# Patient Record
Sex: Female | Born: 2005 | Race: White | Hispanic: No | Marital: Single | Smoking: Never smoker
Health system: Southern US, Community
[De-identification: ages and names within clinical notes are randomized; demographics above are authoritative.]

## PROBLEM LIST (undated history)

## (undated) DIAGNOSIS — S43439A Superior glenoid labrum lesion of unspecified shoulder, initial encounter: Secondary | ICD-10-CM

## (undated) HISTORY — PX: ADENOIDECTOMY: SUR15

## (undated) HISTORY — PX: TYMPANOPLASTY: SHX33

---
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
## 2024-01-01 ENCOUNTER — Ambulatory Visit (HOSPITAL_BASED_OUTPATIENT_CLINIC_OR_DEPARTMENT_OTHER): Attending: Orthopaedic Surgery

## 2024-01-01 ENCOUNTER — Encounter (HOSPITAL_BASED_OUTPATIENT_CLINIC_OR_DEPARTMENT_OTHER): Payer: Self-pay

## 2024-01-01 DIAGNOSIS — M6281 Muscle weakness (generalized): Secondary | ICD-10-CM | POA: Diagnosis not present

## 2024-01-01 DIAGNOSIS — M25611 Stiffness of right shoulder, not elsewhere classified: Secondary | ICD-10-CM | POA: Insufficient documentation

## 2024-01-01 DIAGNOSIS — M25511 Pain in right shoulder: Secondary | ICD-10-CM | POA: Insufficient documentation

## 2024-01-01 NOTE — Therapy (Signed)
 OUTPATIENT PHYSICAL THERAPY SHOULDER TREATMENT   Patient Name: Barbara Crane MRN: [id number] DOB:[birth year], 18 y.o., female Today's Date: 01/01/2024  END OF SESSION:  PT End of Session - 01/01/24 1018     Visit Number 17    Number of Visits 28    Date for PT Re-Evaluation 01/08/24    Authorization Type [employer] [employer]    PT Start Time 0930    PT Stop Time 1012    PT Time Calculation (min) 42 min    Activity Tolerance Patient tolerated treatment well    Behavior During Therapy [facility] for tasks assessed/performed                       Past Medical History:  Diagnosis Date   Atypical mole 03/12/2020   left upper back (moderate to severe)   Labral tear of shoulder    Past Surgical History:  Procedure Laterality Date   ADENOIDECTOMY     SHOULDER ARTHROSCOPY WITH LABRAL REPAIR Right 10/11/2023   Procedure: RIGHT SHOULDER ARTHROSCOPY WITH LABRAL REPAIR;  Surgeon: Wilhelmenia Harada, MD;  Location: [facility];  Service: Orthopedics;  Laterality: Right;   TYMPANOPLASTY     Patient Active Problem List   Diagnosis Date Noted   Tear of right glenoid labrum 10/11/2023     REFERRING PROVIDER: Wilhelmenia Harada, MD  REFERRING DIAG: [phone] (ICD-10-CM) - Tear of right glenoid labrum, initial encounter  THERAPY DIAG:  Muscle weakness (generalized)  Stiffness of right shoulder, not elsewhere classified  Right shoulder pain, unspecified chronicity  Rationale for Evaluation and Treatment: Rehabilitation  ONSET DATE: DOS  10/11/2023  Days since surgery: 82   SUBJECTIVE:                                                                                                                                                                                      SUBJECTIVE STATEMENT:  Pt reports her L knee has been hurting more. Has been swelling a little. Only mild muscle soreness after last session.    Hand dominance: Right  PERTINENT HISTORY: Right shoulder  inferior labral repair on 10/11/2023.    PAIN:  0/10 R shoulder  PRECAUTIONS: Other: per surgical protocol   WEIGHT BEARING RESTRICTIONS: Yes R UE  FALLS:  Has patient fallen in last 6 months? No  LIVING ENVIRONMENT: Lives with: lives with mom Lives in: 2 story home Stairs: yes   OCCUPATION: Pt is a Consulting civil engineer  PLOF: Independent.  Pt was able to play soccer and throw a ball.    PATIENT GOALS:  to be able to play soccer.  To be able to throw a  ball  NEXT MD VISIT:   OBJECTIVE:  Note: Objective measures were completed at Evaluation unless otherwise noted.  DIAGNOSTIC FINDINGS:  Pt is post op.  She had an x ray and MRI last year.    UEFI: UEFI Score = 66  Interpretation: The indication in the original study is that lower scores indicate that the subject is reporting increased difficulty with the activities as a result of their upper limb condition. While higher scores indicate less severity.  UPPER EXTREMITY MMT:     HHD Right 5/7 Left 5/7  Shoulder flexion  14.0 25.6  Shoulder ext  19.3 14.5  Shoulder abduction  16.2 24.9  Shoulder adduction      Shoulder internal rotation  14.6  14.4  Shoulder external rotation  14.1  14.0  Elbow flexion      Elbow extension      Wrist flexion      Wrist extension      Wrist ulnar deviation      Wrist radial deviation      Wrist pronation      Wrist supination      (Blank rows = not tested).  AROM Right 5/7  Left 5/7  Shoulder flexion 165   Shoulder extension    Shoulder abduction 165   Shoulder adduction    Shoulder internal rotation 60   Shoulder external rotation 80   Elbow flexion    Elbow extension    Wrist flexion    Wrist extension    Wrist ulnar deviation    Wrist radial deviation    Wrist pronation    Wrist supination    (Blank rows = not tested)                                                                                                                            TREATMENT DATE:   6/3 UBE  ea way L3 Pt received R shoulder PROM in flexion, abd, ER, and IR w/n pt and tissue tolerance w/n protocol ranges  Supine serratus punches3# 2x15 Supine rhythmic stab's at 90 deg flexion x30 sec ea Prone hz abd 2x10 1# Prone flexion 2x10 1# Prone extension 2x10 2# Standing flexion 1# 2x10 Prone row  1# 2x10 Standing scaption 1# 2x10 Quadruped push ups x10 Ball rolls 4 way at wall 2.2# ball x30ea Side planks 10sec x5 Bicep curls 5# 2x10 Triceps extension GTB x30  5/30 UBE ea way L3 Pt received R shoulder PROM in flexion, abd, ER, and IR w/n pt and tissue tolerance w/n protocol ranges  Supine serratus punches3# 2x10 Supine rhythmic stab's at 90 deg flexion x30 sec ea Prone hz abd 2x10 1# Prone flexion 2x10 1# Prone extension 2x10 2# Standing flexion 1# 2x10 Prone row  1# 2x10 Standing scaption 1# 2x10 Wall push ups 2x10 Ball rolls 4 way at wall 2.2# ball x20ea Side planks 10sec x4 TRX row 2x10    5/28 Pt received R  shoulder PROM in flexion, abd, ER, and IR w/n pt and tissue tolerance w/n protocol ranges  Supine serratus punch 2# 2x12, 3# x10 Supine rhythmic stab's at 90 deg flexion 3x30 sec Prone hz abd 2x10 1#  Prone row 2x10 2# Prone extension x10 with 0#, x10 with 1# Standing jobe's flexion 1# x10, 2x10 2# Standing scaption 1#  3x10 ER/IR with arm at side 3x10 each with RTB    PATIENT EDUCATION: Education details: post op and protocol restrictions and limitations, ice usage, dx, relevant anatomy, prognosis, POC, exercise form, rationale of exercises, and HEP. Person educated: Patient Education method:  Explanation, Demonstration, Tactile cues, Verbal cues Education comprehension:  verbalized understanding, returned demonstration, verbal cues required, tactile cues required  HOME EXERCISE PROGRAM: Access Code: [email or web address] URL: [email or web address] Date: 10/17/2023 Prepared by: Marnie Siren      ASSESSMENT:  CLINICAL  IMPRESSION:  Good tolerance for progressions to therex today without complaint of pain. Improving with position with side planks, though some cuing still required. Demonstrates some shoulder weakness with bicep curls and push ups.  Advised pt see MD regarding L knee since it has not improved.     OBJECTIVE IMPAIRMENTS: decreased activity tolerance, decreased endurance, decreased ROM, decreased strength, hypomobility, impaired UE functional use, and pain.   ACTIVITY LIMITATIONS: carrying, lifting, bathing, dressing, reach over head, and hygiene/grooming  PARTICIPATION LIMITATIONS: cleaning, driving, community activity, and recreational acitivites  PERSONAL FACTORS:   REHAB POTENTIAL: Good  CLINICAL DECISION MAKING: Stable/uncomplicated  EVALUATION COMPLEXITY: Low   GOALS:  SHORT TERM GOALS:   Pt will be independent and compliant with HEP for improved pain, ROM, strength, and function.  Baseline: Goal status: MET Target date:  11/13/2023   2.  Pt will demo improved R shoulder PROM and AAROM to 90 deg of flexion and 20 deg of ER per protocol for improved stiffness and ROM  Baseline:  Goal status: GOAL MET  4/17 Target date:  11/13/2023  3.  Pt will demo improved R shoulder PROM to 130 deg of flexion and 40 deg of ER per protocol for improved stiffness and ROM  Baseline:  Goal status: MET Target date:  12/04/2023   4.  Pt will demo improved R shoulder AAROM to 130 deg of flexion in supine and 45 deg of ER and 90-100 deg of flexion AROM in supine for improved stiffness and shoulder mobility  Baseline:  Goal status: MTE Target date:  12/11/2023  5.  Pt will wean out of sling per protocol and MD orders without adverse effects  Baseline:  Goal status: MET Target date:  11/27/2023   6.  Pt will be able to actively elevate R UE > 90 deg in standing without significant shoulder hike for improved reaching  Baseline:  Goal status: MET Target date: 12/25/2023  7.  Pt will be able  to perform her self care activities with no > than minimal difficulty.   Goal status:  MET  Target date:  01/08/2024  8.  Pt will be able to reach into an overhead cabinet without difficulty.   Goal status:  MET  Target date:  01/15/2024    LONG TERM GOALS: Target date: 02/05/2024   Pt will be able to perform her ADLs and IADLs without significant difficulty or pain.  Baseline:  Goal status: ongoing  2.  Pt will be able to perform her normal reaching and overhead activities without significant difficulty or pain  Baseline:  Goal status: ongoing  3.  Pt will demo  R shoulder AROM to be [facility] t/o for performance of ADLs and IADLs.  Baseline:  Goal status: ongoing  4.  Pt will demo 5/5 strength t/o R shoulder for functional carrying and lifting and to assist with returning to recreational activities.  Baseline:  Goal status: ongoing    PLAN:  PT FREQUENCY: 1x/wk x 3-4 weeks and 2x/wk afterwards  PT DURATION: other: 16 weeks  PLANNED INTERVENTIONS: 97164- PT Re-evaluation, 97110-Therapeutic exercises, 97530- Therapeutic activity, 97112- Neuromuscular re-education, 97535- Self Care, [id number]- Manual therapy, [phone]- Aquatic Therapy, G0283- Electrical stimulation (unattended), 97035- Ultrasound, Patient/Family education, Taping, Dry Needling, Joint mobilization, Spinal mobilization, Scar mobilization, Cryotherapy, and Moist heat  PLAN FOR NEXT SESSION: Cont per Dr. Verline Glow labral repair protocol.    Herb Loges, PTA  01/01/24 10:42 AM

## 2024-01-10 ENCOUNTER — Ambulatory Visit (HOSPITAL_BASED_OUTPATIENT_CLINIC_OR_DEPARTMENT_OTHER)

## 2024-01-10 ENCOUNTER — Encounter (HOSPITAL_BASED_OUTPATIENT_CLINIC_OR_DEPARTMENT_OTHER): Payer: Self-pay

## 2024-01-10 DIAGNOSIS — M6281 Muscle weakness (generalized): Secondary | ICD-10-CM | POA: Diagnosis not present

## 2024-01-10 DIAGNOSIS — M25611 Stiffness of right shoulder, not elsewhere classified: Secondary | ICD-10-CM

## 2024-01-10 DIAGNOSIS — M25511 Pain in right shoulder: Secondary | ICD-10-CM | POA: Diagnosis not present

## 2024-01-10 NOTE — Therapy (Addendum)
 OUTPATIENT PHYSICAL THERAPY SHOULDER TREATMENT Progress Note Reporting Period 10/16/2023 to 01/08/2024  See note below for Objective Data and Assessment of Progress/Goals.       Patient Name: Barbara Crane MRN: [id number] DOB:04/05/2006, 18 y.o., female Today's Date: 01/10/2024  END OF SESSION:  PT End of Session - 01/10/24 1053     Visit Number 18    Number of Visits 26   Date for PT Re-Evaluation 02/07/24    Authorization Type Garth Kansky    PT Start Time 1055    PT Stop Time 1143    PT Time Calculation (min) 48 min    Activity Tolerance Patient tolerated treatment well    Behavior During Therapy [facility] for tasks assessed/performed                     Past Medical History:  Diagnosis Date   Atypical mole 03/12/2020   left upper back (moderate to severe)   Labral tear of shoulder    Past Surgical History:  Procedure Laterality Date   ADENOIDECTOMY     SHOULDER ARTHROSCOPY WITH LABRAL REPAIR Right 10/11/2023   Procedure: RIGHT SHOULDER ARTHROSCOPY WITH LABRAL REPAIR;  Surgeon: Wilhelmenia Harada, MD;  Location: [facility];  Service: Orthopedics;  Laterality: Right;   TYMPANOPLASTY     Patient Active Problem List   Diagnosis Date Noted   Tear of right glenoid labrum 10/11/2023     REFERRING PROVIDER: Wilhelmenia Harada, MD  REFERRING DIAG: [phone] (ICD-10-CM) - Tear of right glenoid labrum, initial encounter  THERAPY DIAG:  Muscle weakness (generalized)  Stiffness of right shoulder, not elsewhere classified  Right shoulder pain, unspecified chronicity  Rationale for Evaluation and Treatment: Rehabilitation  ONSET DATE: DOS  10/11/2023  Days since surgery: 91   SUBJECTIVE:                                                                                                                                                                                      SUBJECTIVE STATEMENT:  Pt reports she sees MD tomorrow for knee and  shoulder. No pain in shoulder.    Hand dominance: Right  PERTINENT HISTORY: Right shoulder inferior labral repair on 10/11/2023.    PAIN:  0/10 R shoulder  PRECAUTIONS: Other: per surgical protocol   WEIGHT BEARING RESTRICTIONS: Yes R UE  FALLS:  Has patient fallen in last 6 months? No  LIVING ENVIRONMENT: Lives with: lives with mom Lives in: 2 story home Stairs: yes   OCCUPATION: Pt is a Consulting civil engineer  PLOF: Independent.  Pt was able to play soccer and throw a ball.    PATIENT GOALS:  to  be able to play soccer.  To be able to throw a ball  NEXT MD VISIT:   OBJECTIVE:  Note: Objective measures were completed at Evaluation unless otherwise noted.  DIAGNOSTIC FINDINGS:  Pt is post op.  She had an x ray and MRI last year.    UEFI: UEFI Score = 66 6/12:70/80  Interpretation: The indication in the original study is that lower scores indicate that the subject is reporting increased difficulty with the activities as a result of their upper limb condition. While higher scores indicate less severity.  UPPER EXTREMITY MMT:     HHD Right 5/7 Left 5/7 Right 6/12 Left 6/12  Shoulder flexion  14.0 25.6 18.7 26.4  Shoulder ext  19.3 14.5    Shoulder abduction  16.2 24.9 17.9 23.3  Shoulder adduction        Shoulder internal rotation  14.6  14.4 13.3 16.0  Shoulder external rotation  14.1  14.0 16.8 15.1  Elbow flexion        Elbow extension        Wrist flexion        Wrist extension        Wrist ulnar deviation        Wrist radial deviation        Wrist pronation        Wrist supination        (Blank rows = not tested).  AROM Right 5/7  Left 5/7 Right 6/12  Shoulder flexion 165  170  Shoulder extension     Shoulder abduction 165  165  Shoulder adduction     Shoulder internal rotation 60  T6  Shoulder external rotation 80  T3  Elbow flexion     Elbow extension     Wrist flexion     Wrist extension     Wrist ulnar deviation     Wrist radial deviation      Wrist pronation     Wrist supination     (Blank rows = not tested)                                                                                                                            TREATMENT DATE:   6/12 UBE ea way L2 Pt received R shoulder PROM in flexion, abd, ER, and IR w/n pt and tissue tolerance w/n protocol ranges  Supine serratus punches3# 2x15 Supine rhythmic stab's at 90 deg flexion x30 sec ea Prone hz abd 2x10 2# Prone flexion 2x10 2# Prone extension 2x10 2# Standing flexion 1# 2x10 Standing scaption 2# 2x10 Standing flexion 2# 2x10 Quadruped push ups x10 Ball rolls 4 way at wall 2.2# ball x30ea Side planks 10sec x6 Bicep curls 5# 2x15 Triceps extension GTB x30 Standing IR 90/90 YTB x10 Standing ER 90/90 YTB x10 Prone ER 90/90 2# 2x10   6/3 UBE ea way L3 Pt received R shoulder PROM in flexion, abd, ER, and IR  w/n pt and tissue tolerance w/n protocol ranges  Supine serratus punches3# 2x15 Supine rhythmic stab's at 90 deg flexion x30 sec ea Prone hz abd 2x10 1# Prone flexion 2x10 1# Prone extension 2x10 2# Standing flexion 1# 2x10 Prone row  1# 2x10 Standing scaption 1# 2x10 Quadruped push ups x10 Ball rolls 4 way at wall 2.2# ball x30ea Side planks 10sec x5 Bicep curls 5# 2x10 Triceps extension GTB x30  5/30 UBE ea way L3 Pt received R shoulder PROM in flexion, abd, ER, and IR w/n pt and tissue tolerance w/n protocol ranges  Supine serratus punches3# 2x10 Supine rhythmic stab's at 90 deg flexion x30 sec ea Prone hz abd 2x10 1# Prone flexion 2x10 1# Prone extension 2x10 2# Standing flexion 1# 2x10 Prone row  1# 2x10 Standing scaption 1# 2x10 Wall push ups 2x10 Ball rolls 4 way at wall 2.2# ball x20ea Side planks 10sec x4 TRX row 2x10    5/28 Pt received R shoulder PROM in flexion, abd, ER, and IR w/n pt and tissue tolerance w/n protocol ranges  Supine serratus punch 2# 2x12, 3# x10 Supine rhythmic stab's at 90 deg  flexion 3x30 sec Prone hz abd 2x10 1#  Prone row 2x10 2# Prone extension x10 with 0#, x10 with 1# Standing jobe's flexion 1# x10, 2x10 2# Standing scaption 1#  3x10 ER/IR with arm at side 3x10 each with RTB    PATIENT EDUCATION: Education details: post op and protocol restrictions and limitations, ice usage, dx, relevant anatomy, prognosis, POC, exercise form, rationale of exercises, and HEP. Person educated: Patient Education method:  Explanation, Demonstration, Tactile cues, Verbal cues Education comprehension:  verbalized understanding, returned demonstration, verbal cues required, tactile cues required  HOME EXERCISE PROGRAM: Access Code: [email or web address] URL: [email or web address] Date: 10/17/2023 Prepared by: Marnie Siren      ASSESSMENT:  CLINICAL IMPRESSION:  Pt has attended 18 visits of PT thus far. Pt has met 8/8 STG and 1/4 LTG.She is 13 weeks s/p shoulder labrum repair. She has progressed excellently thus far. ROM is measured WNL each plane. Pt does demonstrate strength discrepancy bilaterally with hand dyno testing. Progressed to 90/90 rotational resisted movements today without complaint NMR has improved, though mild impairment noted here. Lack endurance with side planks. Pt will benefit from additional PT to progress higher level tasks to prevent re-injury.    OBJECTIVE IMPAIRMENTS: decreased activity tolerance, decreased endurance, decreased ROM, decreased strength, hypomobility, impaired UE functional use, and pain.   ACTIVITY LIMITATIONS: carrying, lifting, bathing, dressing, reach over head, and hygiene/grooming  PARTICIPATION LIMITATIONS: cleaning, driving, community activity, and recreational acitivites  PERSONAL FACTORS:   REHAB POTENTIAL: Good  CLINICAL DECISION MAKING: Stable/uncomplicated  EVALUATION COMPLEXITY: Low   GOALS:  SHORT TERM GOALS:   Pt will be independent and compliant with HEP for improved pain, ROM, strength, and  function.  Baseline: Goal status: MET Target date:  11/13/2023   2.  Pt will demo improved R shoulder PROM and AAROM to 90 deg of flexion and 20 deg of ER per protocol for improved stiffness and ROM  Baseline:  Goal status: GOAL MET  4/17 Target date:  11/13/2023  3.  Pt will demo improved R shoulder PROM to 130 deg of flexion and 40 deg of ER per protocol for improved stiffness and ROM  Baseline:  Goal status: MET Target date:  12/04/2023   4.  Pt will demo improved R shoulder AAROM to 130 deg of flexion in supine and 45 deg of ER and  90-100 deg of flexion AROM in supine for improved stiffness and shoulder mobility  Baseline:  Goal status: MTE Target date:  12/11/2023  5.  Pt will wean out of sling per protocol and MD orders without adverse effects  Baseline:  Goal status: MET Target date:  11/27/2023   6.  Pt will be able to actively elevate R UE > 90 deg in standing without significant shoulder hike for improved reaching  Baseline:  Goal status: MET Target date: 12/25/2023  7.  Pt will be able to perform her self care activities with no > than minimal difficulty.   Goal status:  MET  Target date:  01/08/2024  8.  Pt will be able to reach into an overhead cabinet without difficulty.   Goal status:  MET  Target date:  01/15/2024    LONG TERM GOALS: Target date: 02/07/2024   Pt will be able to perform her ADLs and IADLs without significant difficulty or pain.  Baseline:  Goal status: ongoing  2.  Pt will be able to perform her normal reaching and overhead activities without significant difficulty or pain  Baseline:  Goal status: ongoing  3.  Pt will demo R shoulder AROM to be [facility] t/o for performance of ADLs and IADLs.  Baseline:  Goal status: MET 6/12  4.  Pt will demo 5/5 strength t/o R shoulder for functional carrying and lifting and to assist with returning to recreational activities.  Baseline:  Goal status: ongoing    PLAN:  PT FREQUENCY: 2x/wk  PT  DURATION: other: 4 weeks  PLANNED INTERVENTIONS: 97164- PT Re-evaluation, 97110-Therapeutic exercises, 97530- Therapeutic activity, 97112- Neuromuscular re-education, 97535- Self Care, 65784- Manual therapy, [phone]- Aquatic Therapy, G0283- Electrical stimulation (unattended), 97035- Ultrasound, Patient/Family education, Taping, Dry Needling, Joint mobilization, Spinal mobilization, Scar mobilization, Cryotherapy, and Moist heat  PLAN FOR NEXT SESSION: Cont per Dr. Verline Glow labral repair protocol.    Herb Loges, PTA  01/10/24 1:30 PM  PT reviewed note and extended authorization date for 4 more weeks. Trina Fujita III PT, DPT 01/11/24 1:56 PM

## 2024-01-11 ENCOUNTER — Ambulatory Visit (HOSPITAL_BASED_OUTPATIENT_CLINIC_OR_DEPARTMENT_OTHER): Admitting: Orthopaedic Surgery

## 2024-01-11 DIAGNOSIS — M25362 Other instability, left knee: Secondary | ICD-10-CM | POA: Diagnosis not present

## 2024-01-11 NOTE — Progress Notes (Signed)
 Post Operative Evaluation    Procedure/Date of Surgery: Right shoulder labral repair 3/13  Interval History:    Presents today 12 weeks status post above procedure overall she is doing very well.  Overall she is doing extremely well from a right shoulder perspective.  She has no pain in the right shoulder and does feel overall quite stable.  With regard to the left knee she is continuing to get pain in the sitting position.  She has tried extensively therapy in the past to strengthen the left knee without any relief.  She has been seen by previous orthopedics in the left knee without any specific treatment recommendations.  She is still having giving out of the left knee and pain with sitting longer durations  PMH/PSH/Family History/Social History/Meds/Allergies:    Past Medical History:  Diagnosis Date   Atypical mole 03/12/2020   left upper back (moderate to severe)   Labral tear of shoulder    Past Surgical History:  Procedure Laterality Date   ADENOIDECTOMY     SHOULDER ARTHROSCOPY WITH LABRAL REPAIR Right 10/11/2023   Procedure: RIGHT SHOULDER ARTHROSCOPY WITH LABRAL REPAIR;  Surgeon: Wilhelmenia Harada, MD;  Location: [facility];  Service: Orthopedics;  Laterality: Right;   TYMPANOPLASTY     Social History   Socioeconomic History   Marital status: Single    Spouse name: Not on file   Number of children: Not on file   Years of education: Not on file   Highest education level: Not on file  Occupational History   Not on file  Tobacco Use   Smoking status: Never   Smokeless tobacco: Not on file  Vaping Use   Vaping status: Some Days   Substances: Nicotine  Substance and Sexual Activity   Alcohol use: No   Drug use: No   Sexual activity: Not on file  Other Topics Concern   Not on file  Social History Narrative   Not on file   Social Drivers of Health   Financial Resource Strain: Not on file  Food Insecurity: Not on  file  Transportation Needs: Not on file  Physical Activity: Not on file  Stress: Not on file  Social Connections: Not on file   Family History  Family history unknown: Yes   Allergies  Allergen Reactions   Decadron [Dexamethasone Sodium Phosphate] Other (See Comments)    Unsure   Amoxicillin Rash   Sulfa Antibiotics Rash   Current Outpatient Medications  Medication Sig Dispense Refill   acetaminophen  (TYLENOL ) 160 MG/5ML elixir Take 15.6 mLs (500 mg total) by mouth every 4 (four) hours as needed for fever. (Patient not taking: Reported on 12/07/2023) 120 mL 0   ibuprofen  (ADVIL ) 100 MG/5ML suspension Take 11.6 mLs (232 mg total) by mouth every 6 (six) hours as needed. Pain (Patient not taking: Reported on 12/07/2023) 30 mL 0   oxyCODONE  (ROXICODONE ) 5 MG/5ML solution Take 3 mLs (3 mg total) by mouth every 4 (four) hours as needed for severe pain (pain score 7-10). (Patient not taking: Reported on 12/07/2023) 15 mL 0   No current facility-administered medications for this visit.   No results found.  Review of Systems:   A ROS was performed including pertinent positives and negatives as documented in the HPI.   Musculoskeletal Exam:    There  were no vitals taken for this visit.  Right shoulder with passive range of motion 135 degrees forward elevation as well as 45 degrees external rotation.  Internal rotation is to T12.  Distal neurosensory exam is intact.  Incisions are well-appearing  Left knee with positive patellar grind, 3 quadrants of lateral patellar motion with full extension.  Range of motion is from -3 to 135 degrees, negative joint line tenderness bilaterally.  Trace effusion  Imaging:      I personally reviewed and interpreted the radiographs.   Assessment:   12 weeks status post right shoulder arthroscopic labral repair overall doing very well.  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  Given  this I did discuss that I would recommend therapy although she has extensively trialed therapy in the past.  As result I would like to proceed with an MRI of her left knee and follow-up disc Plan :    - Plan for an MRI left knee and follow-up to discuss results      I personally saw and evaluated the patient, and participated in the management and treatment plan.  Wilhelmenia Harada, MD Attending Physician, Orthopedic Surgery  This document was dictated using Dragon voice recognition software. A reasonable attempt at proof reading has been made to minimize errors.

## 2024-01-11 NOTE — Addendum Note (Signed)
 Addended by: Grier Leber on: 01/11/2024 01:57 PM   Modules accepted: Orders

## 2024-01-15 ENCOUNTER — Encounter (HOSPITAL_BASED_OUTPATIENT_CLINIC_OR_DEPARTMENT_OTHER): Payer: Self-pay

## 2024-01-15 ENCOUNTER — Ambulatory Visit (HOSPITAL_BASED_OUTPATIENT_CLINIC_OR_DEPARTMENT_OTHER)

## 2024-01-15 DIAGNOSIS — M25611 Stiffness of right shoulder, not elsewhere classified: Secondary | ICD-10-CM

## 2024-01-15 DIAGNOSIS — M6281 Muscle weakness (generalized): Secondary | ICD-10-CM | POA: Diagnosis not present

## 2024-01-15 DIAGNOSIS — M25511 Pain in right shoulder: Secondary | ICD-10-CM

## 2024-01-15 NOTE — Therapy (Signed)
 OUTPATIENT PHYSICAL THERAPY SHOULDER TREATMENT .       Patient Name: Barbara Crane MRN: [id number] DOB:[birth year], 18 y.o., female Today's Date: 01/15/2024  END OF SESSION:  PT End of Session - 01/10/24 1053     Visit Number 18    Number of Visits 26   Date for PT Re-Evaluation 02/07/24    Authorization Type Garth Kansky    PT Start Time 1055    PT Stop Time 1143    PT Time Calculation (min) 48 min    Activity Tolerance Patient tolerated treatment well    Behavior During Therapy [facility] for tasks assessed/performed                     Past Medical History:  Diagnosis Date   Atypical mole 03/12/2020   left upper back (moderate to severe)   Labral tear of shoulder    Past Surgical History:  Procedure Laterality Date   ADENOIDECTOMY     SHOULDER ARTHROSCOPY WITH LABRAL REPAIR Right 10/11/2023   Procedure: RIGHT SHOULDER ARTHROSCOPY WITH LABRAL REPAIR;  Surgeon: Wilhelmenia Harada, MD;  Location: [facility];  Service: Orthopedics;  Laterality: Right;   TYMPANOPLASTY     Patient Active Problem List   Diagnosis Date Noted   Tear of right glenoid labrum 10/11/2023     REFERRING PROVIDER: Wilhelmenia Harada, MD  REFERRING DIAG: [phone] (ICD-10-CM) - Tear of right glenoid labrum, initial encounter  THERAPY DIAG:  Muscle weakness (generalized)  Stiffness of right shoulder, not elsewhere classified  Right shoulder pain, unspecified chronicity  Rationale for Evaluation and Treatment: Rehabilitation  ONSET DATE: DOS  10/11/2023  Days since surgery: 96   SUBJECTIVE:                                                                                                                                                                                      SUBJECTIVE STATEMENT:  Pt reports MD is ordering MRI for her knee. Shoulder has been doing fine.    Hand dominance: Right  PERTINENT HISTORY: Right shoulder inferior labral repair on 10/11/2023.     PAIN:  0/10 R shoulder  PRECAUTIONS: Other: per surgical protocol   WEIGHT BEARING RESTRICTIONS: Yes R UE  FALLS:  Has patient fallen in last 6 months? No  LIVING ENVIRONMENT: Lives with: lives with mom Lives in: 2 story home Stairs: yes   OCCUPATION: Pt is a Consulting civil engineer  PLOF: Independent.  Pt was able to play soccer and throw a ball.    PATIENT GOALS:  to be able to play soccer.  To be able to throw a ball  NEXT MD VISIT:  OBJECTIVE:  Note: Objective measures were completed at Evaluation unless otherwise noted.  DIAGNOSTIC FINDINGS:  Pt is post op.  She had an x ray and MRI last year.    UEFI: UEFI Score = 66 6/12:70/80  Interpretation: The indication in the original study is that lower scores indicate that the subject is reporting increased difficulty with the activities as a result of their upper limb condition. While higher scores indicate less severity.  UPPER EXTREMITY MMT:     HHD Right 5/7 Left 5/7 Right 6/12 Left 6/12  Shoulder flexion  14.0 25.6 18.7 26.4  Shoulder ext  19.3 14.5    Shoulder abduction  16.2 24.9 17.9 23.3  Shoulder adduction        Shoulder internal rotation  14.6  14.4 13.3 16.0  Shoulder external rotation  14.1  14.0 16.8 15.1  Elbow flexion        Elbow extension        Wrist flexion        Wrist extension        Wrist ulnar deviation        Wrist radial deviation        Wrist pronation        Wrist supination        (Blank rows = not tested).  AROM Right 5/7  Left 5/7 Right 6/12  Shoulder flexion 165  170  Shoulder extension     Shoulder abduction 165  165  Shoulder adduction     Shoulder internal rotation 60  T6  Shoulder external rotation 80  T3  Elbow flexion     Elbow extension     Wrist flexion     Wrist extension     Wrist ulnar deviation     Wrist radial deviation     Wrist pronation     Wrist supination     (Blank rows = not tested)                                                                                                                             TREATMENT DATE:   6/12 UBE ea way L2 Pt received R shoulder PROM in flexion, abd, ER, and IR w/n pt and tissue tolerance w/n protocol ranges  Supine serratus punches3# 2x15 Supine rhythmic stab's at 90 deg flexion x30 sec ea Prone hz abd 2x10 2# Prone flexion 2x10 2# Prone extension 2x10 2# Prone ER 90/90 2# 2x10 Standing flexion 2# 2x10 Standing scaption 2# 2x10 Standing PNF D2 extension GTB 2x10 Standing PNF D2 flexion GTB 2x10 Quadruped push ups 2x10 Serratus push ups at wall 2x10 Ball rolls 4 way at wall 2.2# ball x30ea Side planks 10sec x7 R only Bicep curls 5# 2x15 Triceps extension GTB x30 Standing IR/ER 90/90 1# 2x10ea in front of mirror    6/3 UBE ea way L3 Pt received R shoulder PROM in flexion, abd, ER, and IR w/n pt and  tissue tolerance w/n protocol ranges  Supine serratus punches3# 2x15 Supine rhythmic stab's at 90 deg flexion x30 sec ea Prone hz abd 2x10 1# Prone flexion 2x10 1# Prone extension 2x10 2# Standing flexion 1# 2x10 Prone row  1# 2x10 Standing scaption 1# 2x10 Quadruped push ups x10 Ball rolls 4 way at wall 2.2# ball x30ea Side planks 10sec x5 Bicep curls 5# 2x10 Triceps extension GTB x30  5/30 UBE ea way L3 Pt received R shoulder PROM in flexion, abd, ER, and IR w/n pt and tissue tolerance w/n protocol ranges  Supine serratus punches3# 2x10 Supine rhythmic stab's at 90 deg flexion x30 sec ea Prone hz abd 2x10 1# Prone flexion 2x10 1# Prone extension 2x10 2# Standing flexion 1# 2x10 Prone row  1# 2x10 Standing scaption 1# 2x10 Wall push ups 2x10 Ball rolls 4 way at wall 2.2# ball x20ea Side planks 10sec x4 TRX row 2x10    5/28 Pt received R shoulder PROM in flexion, abd, ER, and IR w/n pt and tissue tolerance w/n protocol ranges  Supine serratus punch 2# 2x12, 3# x10 Supine rhythmic stab's at 90 deg flexion 3x30 sec Prone hz abd 2x10 1#  Prone row 2x10  2# Prone extension x10 with 0#, x10 with 1# Standing jobe's flexion 1# x10, 2x10 2# Standing scaption 1#  3x10 ER/IR with arm at side 3x10 each with RTB    PATIENT EDUCATION: Education details: post op and protocol restrictions and limitations, ice usage, dx, relevant anatomy, prognosis, POC, exercise form, rationale of exercises, and HEP. Person educated: Patient Education method:  Explanation, Demonstration, Tactile cues, Verbal cues Education comprehension:  verbalized understanding, returned demonstration, verbal cues required, tactile cues required  HOME EXERCISE PROGRAM: Access Code: [email or web address] URL: [email or web address] Date: 10/17/2023 Prepared by: Marnie Siren      ASSESSMENT:  CLINICAL IMPRESSION:  Continued with shoulder progressions today with good tolerance. She is improving with endurance for side plank exercise. Cuing required for maintaining proper 90/90 position with IR/ER. Reported difficulty with ER from this position compared to  shoulder. No complaints of pain with exercises. Will continue to progress as tolerated.    OBJECTIVE IMPAIRMENTS: decreased activity tolerance, decreased endurance, decreased ROM, decreased strength, hypomobility, impaired UE functional use, and pain.   ACTIVITY LIMITATIONS: carrying, lifting, bathing, dressing, reach over head, and hygiene/grooming  PARTICIPATION LIMITATIONS: cleaning, driving, community activity, and recreational acitivites  PERSONAL FACTORS:   REHAB POTENTIAL: Good  CLINICAL DECISION MAKING: Stable/uncomplicated  EVALUATION COMPLEXITY: Low   GOALS:  SHORT TERM GOALS:   Pt will be independent and compliant with HEP for improved pain, ROM, strength, and function.  Baseline: Goal status: MET Target date:  11/13/2023   2.  Pt will demo improved R shoulder PROM and AAROM to 90 deg of flexion and 20 deg of ER per protocol for improved stiffness and ROM  Baseline:  Goal status: GOAL MET   4/17 Target date:  11/13/2023  3.  Pt will demo improved R shoulder PROM to 130 deg of flexion and 40 deg of ER per protocol for improved stiffness and ROM  Baseline:  Goal status: MET Target date:  12/04/2023   4.  Pt will demo improved R shoulder AAROM to 130 deg of flexion in supine and 45 deg of ER and 90-100 deg of flexion AROM in supine for improved stiffness and shoulder mobility  Baseline:  Goal status: MTE Target date:  12/11/2023  5.  Pt will wean out of sling per protocol and MD  orders without adverse effects  Baseline:  Goal status: MET Target date:  11/27/2023   6.  Pt will be able to actively elevate R UE > 90 deg in standing without significant shoulder hike for improved reaching  Baseline:  Goal status: MET Target date: 12/25/2023  7.  Pt will be able to perform her self care activities with no > than minimal difficulty.   Goal status:  MET  Target date:  01/08/2024  8.  Pt will be able to reach into an overhead cabinet without difficulty.   Goal status:  MET  Target date:  01/15/2024    LONG TERM GOALS: Target date: 02/07/2024   Pt will be able to perform her ADLs and IADLs without significant difficulty or pain.  Baseline:  Goal status: ongoing  2.  Pt will be able to perform her normal reaching and overhead activities without significant difficulty or pain  Baseline:  Goal status: ongoing  3.  Pt will demo R shoulder AROM to be [facility] t/o for performance of ADLs and IADLs.  Baseline:  Goal status: MET 6/12  4.  Pt will demo 5/5 strength t/o R shoulder for functional carrying and lifting and to assist with returning to recreational activities.  Baseline:  Goal status: ongoing    PLAN:  PT FREQUENCY: 2x/wk  PT DURATION: other: 4 weeks  PLANNED INTERVENTIONS: 97164- PT Re-evaluation, 97110-Therapeutic exercises, 97530- Therapeutic activity, 97112- Neuromuscular re-education, 97535- Self Care, 40981- Manual therapy, [phone]- Aquatic Therapy, G0283-  Electrical stimulation (unattended), 97035- Ultrasound, Patient/Family education, Taping, Dry Needling, Joint mobilization, Spinal mobilization, Scar mobilization, Cryotherapy, and Moist heat  PLAN FOR NEXT SESSION: Cont per Dr. Verline Glow labral repair protocol.    Herb Loges, PTA  01/15/24 5:00 PM

## 2024-01-21 ENCOUNTER — Encounter (HOSPITAL_BASED_OUTPATIENT_CLINIC_OR_DEPARTMENT_OTHER): Payer: Self-pay | Admitting: Physical Therapy

## 2024-01-21 ENCOUNTER — Ambulatory Visit (HOSPITAL_BASED_OUTPATIENT_CLINIC_OR_DEPARTMENT_OTHER): Admitting: Physical Therapy

## 2024-01-21 DIAGNOSIS — M25611 Stiffness of right shoulder, not elsewhere classified: Secondary | ICD-10-CM | POA: Diagnosis not present

## 2024-01-21 DIAGNOSIS — M25511 Pain in right shoulder: Secondary | ICD-10-CM

## 2024-01-21 DIAGNOSIS — M6281 Muscle weakness (generalized): Secondary | ICD-10-CM | POA: Diagnosis not present

## 2024-01-21 NOTE — Therapy (Signed)
 OUTPATIENT PHYSICAL THERAPY SHOULDER TREATMENT .       Patient Name: Barbara Crane MRN: [id number] DOB:2005-10-06, 18 y.o., female Today's Date: 01/21/2024  END OF SESSION:  PT End of Session - 01/10/24 1053     Visit Number 18    Number of Visits 26   Date for PT Re-Evaluation 02/07/24    Authorization Type [name]    PT Start Time 1055    PT Stop Time 1143    PT Time Calculation (min) 48 min    Activity Tolerance Patient tolerated treatment well    Behavior During Therapy [facility] for tasks assessed/performed                     Past Medical History:  Diagnosis Date   Atypical mole 03/12/2020   left upper back (moderate to severe)   Labral tear of shoulder    Past Surgical History:  Procedure Laterality Date   ADENOIDECTOMY     SHOULDER ARTHROSCOPY WITH LABRAL REPAIR Right 10/11/2023   Procedure: RIGHT SHOULDER ARTHROSCOPY WITH LABRAL REPAIR;  Surgeon: Genelle Standing, MD;  Location: [facility];  Service: Orthopedics;  Laterality: Right;   TYMPANOPLASTY     Patient Active Problem List   Diagnosis Date Noted   Tear of right glenoid labrum 10/11/2023     REFERRING PROVIDER: Genelle Standing, MD  REFERRING DIAG: [phone] (ICD-10-CM) - Tear of right glenoid labrum, initial encounter  THERAPY DIAG:  Muscle weakness (generalized)  Stiffness of right shoulder, not elsewhere classified  Right shoulder pain, unspecified chronicity  Rationale for Evaluation and Treatment: Rehabilitation  ONSET DATE: DOS  10/11/2023  Days since surgery: 102   SUBJECTIVE:                                                                                                                                                                                      SUBJECTIVE STATEMENT:  Pt reports no issues with the shoulder since last session. Has been compliant with HEP. Previous session was difficult.    Hand dominance: Right  PERTINENT HISTORY: Right  shoulder inferior labral repair on 10/11/2023.    PAIN:  0/10 R shoulder  PRECAUTIONS: Other: per surgical protocol   WEIGHT BEARING RESTRICTIONS: Yes R UE  FALLS:  Has patient fallen in last 6 months? No  LIVING ENVIRONMENT: Lives with: lives with mom Lives in: 2 story home Stairs: yes   OCCUPATION: Pt is a Consulting civil engineer  PLOF: Independent.  Pt was able to play soccer and throw a ball.    PATIENT GOALS:  to be able to play soccer.  To be able to throw a  ball  NEXT MD VISIT:   OBJECTIVE:  Note: Objective measures were completed at Evaluation unless otherwise noted.  DIAGNOSTIC FINDINGS:  Pt is post op.  She had an x ray and MRI last year.    UEFI: UEFI Score = 66 6/12:70/80  Interpretation: The indication in the original study is that lower scores indicate that the subject is reporting increased difficulty with the activities as a result of their upper limb condition. While higher scores indicate less severity.  UPPER EXTREMITY MMT:     HHD Right 5/7 Left 5/7 Right 6/12 Left 6/12  Shoulder flexion  14.0 25.6 18.7 26.4  Shoulder ext  19.3 14.5    Shoulder abduction  16.2 24.9 17.9 23.3  Shoulder adduction        Shoulder internal rotation  14.6  14.4 13.3 16.0  Shoulder external rotation  14.1  14.0 16.8 15.1  Elbow flexion        Elbow extension        Wrist flexion        Wrist extension        Wrist ulnar deviation        Wrist radial deviation        Wrist pronation        Wrist supination        (Blank rows = not tested).  AROM Right 5/7  Left 5/7 Right 6/12  Shoulder flexion 165  170  Shoulder extension     Shoulder abduction 165  165  Shoulder adduction     Shoulder internal rotation 60  T6  Shoulder external rotation 80  T3  Elbow flexion     Elbow extension     Wrist flexion     Wrist extension     Wrist ulnar deviation     Wrist radial deviation     Wrist pronation     Wrist supination     (Blank rows = not tested)                                                                                                                             TREATMENT DATE:   6/23  UBE 2.5 min, lvl 1 each way  Bear position shoulder taps in protraction 3x12 Side plank 3x8 1s holds 90/90 RTB IR/ER 3x10 ea Kneeling push 3x8 Single arm blue TB row double band 3x10 OH wall bounce 3x20 Lateral raise 3# 3x8 Arnold press 3# 3x8   6/12 UBE ea way L2 Pt received R shoulder PROM in flexion, abd, ER, and IR w/n pt and tissue tolerance w/n protocol ranges  Supine serratus punches3# 2x15 Supine rhythmic stab's at 90 deg flexion x30 sec ea Prone hz abd 2x10 2# Prone flexion 2x10 2# Prone extension 2x10 2# Prone ER 90/90 2# 2x10 Standing flexion 2# 2x10 Standing scaption 2# 2x10 Standing PNF D2 extension GTB 2x10 Standing PNF D2 flexion GTB 2x10 Quadruped push ups 2x10 Serratus push  ups at wall 2x10 Ball rolls 4 way at wall 2.2# ball x30ea Side planks 10sec x7 R only Bicep curls 5# 2x15 Triceps extension GTB x30 Standing IR/ER 90/90 1# 2x10ea in front of mirror    6/3 UBE ea way L3 Pt received R shoulder PROM in flexion, abd, ER, and IR w/n pt and tissue tolerance w/n protocol ranges  Supine serratus punches3# 2x15 Supine rhythmic stab's at 90 deg flexion x30 sec ea Prone hz abd 2x10 1# Prone flexion 2x10 1# Prone extension 2x10 2# Standing flexion 1# 2x10 Prone row  1# 2x10 Standing scaption 1# 2x10 Quadruped push ups x10 Ball rolls 4 way at wall 2.2# ball x30ea Side planks 10sec x5 Bicep curls 5# 2x10 Triceps extension GTB x30  5/30 UBE ea way L3 Pt received R shoulder PROM in flexion, abd, ER, and IR w/n pt and tissue tolerance w/n protocol ranges  Supine serratus punches3# 2x10 Supine rhythmic stab's at 90 deg flexion x30 sec ea Prone hz abd 2x10 1# Prone flexion 2x10 1# Prone extension 2x10 2# Standing flexion 1# 2x10 Prone row  1# 2x10 Standing scaption 1# 2x10 Wall push ups 2x10 Ball rolls 4  way at wall 2.2# ball x20ea Side planks 10sec x4 TRX row 2x10    5/28 Pt received R shoulder PROM in flexion, abd, ER, and IR w/n pt and tissue tolerance w/n protocol ranges  Supine serratus punch 2# 2x12, 3# x10 Supine rhythmic stab's at 90 deg flexion 3x30 sec Prone hz abd 2x10 1#  Prone row 2x10 2# Prone extension x10 with 0#, x10 with 1# Standing jobe's flexion 1# x10, 2x10 2# Standing scaption 1#  3x10 ER/IR with arm at side 3x10 each with RTB    PATIENT EDUCATION: Education details: post op and protocol restrictions and limitations, ice usage, dx, relevant anatomy, prognosis, POC, exercise form, rationale of exercises, and HEP. Person educated: Patient Education method:  Explanation, Demonstration, Tactile cues, Verbal cues Education comprehension:  verbalized understanding, returned demonstration, verbal cues required, tactile cues required  HOME EXERCISE PROGRAM: Access Code: [email or web address] URL: [email or web address] Date: 10/17/2023 Prepared by: Mose Minerva      ASSESSMENT:  CLINICAL IMPRESSION:  Pt 14 wks at this time. Pt CKC stability and OH strength introduced. Pt able to start very light OH plyo and OH strengthening type exercise without pain. Fatigue does set in quickly and pt loses scapular control but able to continue with all exercise when given appropriate cuing. Plan to continue with strengthening per protocol as pt would like to throw again in the future. Pt would benefit from continued skilled therapy in order to reach goals and maximize functional R UE strength and ROM for full return to PLOF.   OBJECTIVE IMPAIRMENTS: decreased activity tolerance, decreased endurance, decreased ROM, decreased strength, hypomobility, impaired UE functional use, and pain.   ACTIVITY LIMITATIONS: carrying, lifting, bathing, dressing, reach over head, and hygiene/grooming  PARTICIPATION LIMITATIONS: cleaning, driving, community activity, and recreational  acitivites  PERSONAL FACTORS:   REHAB POTENTIAL: Good  CLINICAL DECISION MAKING: Stable/uncomplicated  EVALUATION COMPLEXITY: Low   GOALS:  SHORT TERM GOALS:   Pt will be independent and compliant with HEP for improved pain, ROM, strength, and function.  Baseline: Goal status: MET Target date:  11/13/2023   2.  Pt will demo improved R shoulder PROM and AAROM to 90 deg of flexion and 20 deg of ER per protocol for improved stiffness and ROM  Baseline:  Goal status: GOAL MET  4/17 Target  date:  11/13/2023  3.  Pt will demo improved R shoulder PROM to 130 deg of flexion and 40 deg of ER per protocol for improved stiffness and ROM  Baseline:  Goal status: MET Target date:  12/04/2023   4.  Pt will demo improved R shoulder AAROM to 130 deg of flexion in supine and 45 deg of ER and 90-100 deg of flexion AROM in supine for improved stiffness and shoulder mobility  Baseline:  Goal status: MTE Target date:  12/11/2023  5.  Pt will wean out of sling per protocol and MD orders without adverse effects  Baseline:  Goal status: MET Target date:  11/27/2023   6.  Pt will be able to actively elevate R UE > 90 deg in standing without significant shoulder hike for improved reaching  Baseline:  Goal status: MET Target date: 12/25/2023  7.  Pt will be able to perform her self care activities with no > than minimal difficulty.   Goal status:  MET  Target date:  01/08/2024  8.  Pt will be able to reach into an overhead cabinet without difficulty.   Goal status:  MET  Target date:  01/15/2024    LONG TERM GOALS: Target date: 02/07/2024   Pt will be able to perform her ADLs and IADLs without significant difficulty or pain.  Baseline:  Goal status: ongoing  2.  Pt will be able to perform her normal reaching and overhead activities without significant difficulty or pain  Baseline:  Goal status: ongoing  3.  Pt will demo R shoulder AROM to be [facility] t/o for performance of ADLs and IADLs.   Baseline:  Goal status: MET 6/12  4.  Pt will demo 5/5 strength t/o R shoulder for functional carrying and lifting and to assist with returning to recreational activities.  Baseline:  Goal status: ongoing    PLAN:  PT FREQUENCY: 2x/wk  PT DURATION: other: 4 weeks  PLANNED INTERVENTIONS: 97164- PT Re-evaluation, 97110-Therapeutic exercises, 97530- Therapeutic activity, 97112- Neuromuscular re-education, 97535- Self Care, [id number]- Manual therapy, [id number]- Aquatic Therapy, G0283- Electrical stimulation (unattended), 97035- Ultrasound, Patient/Family education, Taping, Dry Needling, Joint mobilization, Spinal mobilization, Scar mobilization, Cryotherapy, and Moist heat  PLAN FOR NEXT SESSION: Cont per Dr. Danetta labral repair protocol.    Dale Call PT, DPT 01/21/24 11:41 AM

## 2024-01-22 ENCOUNTER — Encounter (HOSPITAL_BASED_OUTPATIENT_CLINIC_OR_DEPARTMENT_OTHER)

## 2024-01-30 ENCOUNTER — Ambulatory Visit (HOSPITAL_BASED_OUTPATIENT_CLINIC_OR_DEPARTMENT_OTHER): Attending: Orthopaedic Surgery | Admitting: Physical Therapy

## 2024-01-30 ENCOUNTER — Encounter (HOSPITAL_BASED_OUTPATIENT_CLINIC_OR_DEPARTMENT_OTHER): Payer: Self-pay | Admitting: Physical Therapy

## 2024-01-30 DIAGNOSIS — M6281 Muscle weakness (generalized): Secondary | ICD-10-CM | POA: Diagnosis not present

## 2024-01-30 DIAGNOSIS — M25511 Pain in right shoulder: Secondary | ICD-10-CM | POA: Diagnosis not present

## 2024-01-30 DIAGNOSIS — M25611 Stiffness of right shoulder, not elsewhere classified: Secondary | ICD-10-CM | POA: Insufficient documentation

## 2024-01-30 NOTE — Therapy (Addendum)
 OUTPATIENT PHYSICAL THERAPY UPPER EXTREMITY EVALUATION   Patient Name: Barbara Crane MRN: [id number] DOB:2005-08-27, 18 y.o., female Today's Date: 02/06/2024  END OF SESSION:   Past Medical History:  Diagnosis Date   Atypical mole 03/12/2020   left upper back (moderate to severe)   Labral tear of shoulder    Past Surgical History:  Procedure Laterality Date   ADENOIDECTOMY     SHOULDER ARTHROSCOPY WITH LABRAL REPAIR Right 10/11/2023   Procedure: RIGHT SHOULDER ARTHROSCOPY WITH LABRAL REPAIR;  Surgeon: Genelle Standing, MD;  Location: [facility];  Service: Orthopedics;  Laterality: Right;   TYMPANOPLASTY     Patient Active Problem List   Diagnosis Date Noted   Tear of right glenoid labrum 10/11/2023     REFERRING PROVIDER: Genelle Standing, MD  REFERRING DIAG: [phone] (ICD-10-CM) - Tear of right glenoid labrum, initial encounter  THERAPY DIAG:  Muscle weakness (generalized)  Stiffness of right shoulder, not elsewhere classified  Right shoulder pain, unspecified chronicity  Rationale for Evaluation and Treatment: Rehabilitation  ONSET DATE: DOS  10/11/2023  Days since surgery: 111   SUBJECTIVE:                                                                                                                                                                                      SUBJECTIVE STATEMENT:  Pt reports no issues with the shoulder since last session. Has been compliant with HEP. Pt reports no significant changes since last session.    Hand dominance: Right  PERTINENT HISTORY: Right shoulder inferior labral repair on 10/11/2023.    PAIN:  0/10 R shoulder  PRECAUTIONS: Other: per surgical protocol   WEIGHT BEARING RESTRICTIONS: Yes R UE  FALLS:  Has patient fallen in last 6 months? No  LIVING ENVIRONMENT: Lives with: lives with mom Lives in: 2 story home Stairs: yes   OCCUPATION: Pt is a Consulting civil engineer  PLOF: Independent.  Pt was able  to play soccer and throw a ball.    PATIENT GOALS:  to be able to play soccer.  To be able to throw a ball  NEXT MD VISIT:   OBJECTIVE:  Note: Objective measures were completed at Evaluation unless otherwise noted.  DIAGNOSTIC FINDINGS:  Pt is post op.  She had an x ray and MRI last year.    UEFI: UEFI Score = 66 6/12:70/80  Interpretation: The indication in the original study is that lower scores indicate that the subject is reporting increased difficulty with the activities as a result of their upper limb condition. While higher scores indicate less severity.  UPPER EXTREMITY MMT:     HHD Right 5/7 Left 5/7 Right  6/12 Left 6/12  Shoulder flexion  14.0 25.6 18.7 26.4  Shoulder ext  19.3 14.5    Shoulder abduction  16.2 24.9 17.9 23.3  Shoulder adduction        Shoulder internal rotation  14.6  14.4 13.3 16.0  Shoulder external rotation  14.1  14.0 16.8 15.1  Elbow flexion        Elbow extension        Wrist flexion        Wrist extension        Wrist ulnar deviation        Wrist radial deviation        Wrist pronation        Wrist supination        (Blank rows = not tested).  AROM Right 5/7  Left 5/7 Right 6/12  Shoulder flexion 165  170  Shoulder extension     Shoulder abduction 165  165  Shoulder adduction     Shoulder internal rotation 60  T6  Shoulder external rotation 80  T3  Elbow flexion     Elbow extension     Wrist flexion     Wrist extension     Wrist ulnar deviation     Wrist radial deviation     Wrist pronation     Wrist supination     (Blank rows = not tested)                                                                                                                            TREATMENT DATE:   7/2  UBE 2.5 min, lvl 1 each way  Explosive kneeling push ups 4x5 Tall arm side plank 3x8 1s holds Pike toe taps 3x12 GTB IR/ER 90/90 2x10 Wall ball chest pass (high force) 4x8 8lb ball Double UE Medball slams: vertical 8 lbs Medball  toss: vertical 4x3 Bicep curls 10lbs 3x6  6/23  UBE 2.5 min, lvl 1 each way  Bear position shoulder taps in protraction 3x12 Side plank 3x8 1s holds 90/90 RTB IR/ER 3x10 ea Kneeling push 3x8 Single arm blue TB row double band 3x10 OH wall bounce 3x20 Lateral raise 3# 3x8 Arnold press 3# 3x8   6/12 UBE ea way L2 Pt received R shoulder PROM in flexion, abd, ER, and IR w/n pt and tissue tolerance w/n protocol ranges  Supine serratus punches3# 2x15 Supine rhythmic stab's at 90 deg flexion x30 sec ea Prone hz abd 2x10 2# Prone flexion 2x10 2# Prone extension 2x10 2# Prone ER 90/90 2# 2x10 Standing flexion 2# 2x10 Standing scaption 2# 2x10 Standing PNF D2 extension GTB 2x10 Standing PNF D2 flexion GTB 2x10 Quadruped push ups 2x10 Serratus push ups at wall 2x10 Ball rolls 4 way at wall 2.2# ball x30ea Side planks 10sec x7 R only Bicep curls 5# 2x15 Triceps extension GTB x30 Standing IR/ER 90/90 1# 2x10ea in front of mirror    6/3 UBE  ea way L3 Pt received R shoulder PROM in flexion, abd, ER, and IR w/n pt and tissue tolerance w/n protocol ranges  Supine serratus punches3# 2x15 Supine rhythmic stab's at 90 deg flexion x30 sec ea Prone hz abd 2x10 1# Prone flexion 2x10 1# Prone extension 2x10 2# Standing flexion 1# 2x10 Prone row  1# 2x10 Standing scaption 1# 2x10 Quadruped push ups x10 Ball rolls 4 way at wall 2.2# ball x30ea Side planks 10sec x5 Bicep curls 5# 2x10 Triceps extension GTB x30  5/30 UBE ea way L3 Pt received R shoulder PROM in flexion, abd, ER, and IR w/n pt and tissue tolerance w/n protocol ranges  Supine serratus punches3# 2x10 Supine rhythmic stab's at 90 deg flexion x30 sec ea Prone hz abd 2x10 1# Prone flexion 2x10 1# Prone extension 2x10 2# Standing flexion 1# 2x10 Prone row  1# 2x10 Standing scaption 1# 2x10 Wall push ups 2x10 Ball rolls 4 way at wall 2.2# ball x20ea Side planks 10sec x4 TRX row  2x10    5/28 Pt received R shoulder PROM in flexion, abd, ER, and IR w/n pt and tissue tolerance w/n protocol ranges  Supine serratus punch 2# 2x12, 3# x10 Supine rhythmic stab's at 90 deg flexion 3x30 sec Prone hz abd 2x10 1#  Prone row 2x10 2# Prone extension x10 with 0#, x10 with 1# Standing jobe's flexion 1# x10, 2x10 2# Standing scaption 1#  3x10 ER/IR with arm at side 3x10 each with RTB    PATIENT EDUCATION: Education details: post op and protocol restrictions and limitations, ice usage, dx, relevant anatomy, prognosis, POC, exercise form, rationale of exercises, and HEP. Person educated: Patient Education method:  Explanation, Demonstration, Tactile cues, Verbal cues Education comprehension:  verbalized understanding, returned demonstration, verbal cues required, tactile cues required  HOME EXERCISE PROGRAM: Access Code: [email or web address] URL: [email or web address] Date: 10/17/2023 Prepared by: Mose Minerva      ASSESSMENT:  CLINICAL IMPRESSION: Pt nearly 4 months post op. Pt with good tolerance to progression of strengthening as well as force development exercise but continues to fatigue quickly. Pt's 90/90 strength has improved greatly since last session but pt lacks power development and high rate for force production in double UE setting. HEP updated today accordingly to promote better power dev on R UE. Pt advised on double UE only at this time. Plan for re-assessment and strength test again at next session for re-cert and decrease in frequency with therapy as long as there are no adverse events. Pt would benefit from continued skilled therapy in order to reach goals and maximize functional R UE strength and ROM for full return to PLOF.   OBJECTIVE IMPAIRMENTS: decreased activity tolerance, decreased endurance, decreased ROM, decreased strength, hypomobility, impaired UE functional use, and pain.   ACTIVITY LIMITATIONS: carrying, lifting, bathing, dressing,  reach over head, and hygiene/grooming  PARTICIPATION LIMITATIONS: cleaning, driving, community activity, and recreational acitivites  PERSONAL FACTORS:   REHAB POTENTIAL: Good  CLINICAL DECISION MAKING: Stable/uncomplicated  EVALUATION COMPLEXITY: Low   GOALS:  SHORT TERM GOALS:   Pt will be independent and compliant with HEP for improved pain, ROM, strength, and function.  Baseline: Goal status: MET Target date:  11/13/2023   2.  Pt will demo improved R shoulder PROM and AAROM to 90 deg of flexion and 20 deg of ER per protocol for improved stiffness and ROM  Baseline:  Goal status: GOAL MET  4/17 Target date:  11/13/2023  3.  Pt will demo improved R shoulder PROM  to 130 deg of flexion and 40 deg of ER per protocol for improved stiffness and ROM  Baseline:  Goal status: MET Target date:  12/04/2023   4.  Pt will demo improved R shoulder AAROM to 130 deg of flexion in supine and 45 deg of ER and 90-100 deg of flexion AROM in supine for improved stiffness and shoulder mobility  Baseline:  Goal status: MTE Target date:  12/11/2023  5.  Pt will wean out of sling per protocol and MD orders without adverse effects  Baseline:  Goal status: MET Target date:  11/27/2023   6.  Pt will be able to actively elevate R UE > 90 deg in standing without significant shoulder hike for improved reaching  Baseline:  Goal status: MET Target date: 12/25/2023  7.  Pt will be able to perform her self care activities with no > than minimal difficulty.   Goal status:  MET  Target date:  01/08/2024  8.  Pt will be able to reach into an overhead cabinet without difficulty.   Goal status:  MET  Target date:  01/15/2024    LONG TERM GOALS: Target date: 02/07/2024   Pt will be able to perform her ADLs and IADLs without significant difficulty or pain.  Baseline:  Goal status: ongoing  2.  Pt will be able to perform her normal reaching and overhead activities without significant difficulty or  pain  Baseline:  Goal status: ongoing  3.  Pt will demo R shoulder AROM to be [facility] t/o for performance of ADLs and IADLs.  Baseline:  Goal status: MET 6/12  4.  Pt will demo 5/5 strength t/o R shoulder for functional carrying and lifting and to assist with returning to recreational activities.  Baseline:  Goal status: ongoing    PLAN:  PT FREQUENCY: 2x/wk  PT DURATION: other: 4 weeks  PLANNED INTERVENTIONS: 97164- PT Re-evaluation, 97110-Therapeutic exercises, 97530- Therapeutic activity, 97112- Neuromuscular re-education, 97535- Self Care, 02859- Manual therapy, [id number]- Aquatic Therapy, [id number]- Electrical stimulation (unattended), 97035- Ultrasound, Patient/Family education, Taping, Dry Needling, Joint mobilization, Spinal mobilization, Scar mobilization, Cryotherapy, and Moist heat  PLAN FOR NEXT SESSION: Cont per Dr. Danetta labral repair protocol.    Dale Call PT, DPT 01/30/24 2:06 PM

## 2024-02-06 ENCOUNTER — Encounter (HOSPITAL_BASED_OUTPATIENT_CLINIC_OR_DEPARTMENT_OTHER): Payer: Self-pay | Admitting: Physical Therapy

## 2024-02-06 ENCOUNTER — Ambulatory Visit (HOSPITAL_BASED_OUTPATIENT_CLINIC_OR_DEPARTMENT_OTHER): Admitting: Physical Therapy

## 2024-02-06 DIAGNOSIS — M25611 Stiffness of right shoulder, not elsewhere classified: Secondary | ICD-10-CM | POA: Diagnosis not present

## 2024-02-06 DIAGNOSIS — M25511 Pain in right shoulder: Secondary | ICD-10-CM

## 2024-02-06 DIAGNOSIS — M6281 Muscle weakness (generalized): Secondary | ICD-10-CM | POA: Diagnosis not present

## 2024-02-06 NOTE — Therapy (Signed)
 OUTPATIENT PHYSICAL THERAPY UPPER EXTREMITY EVALUATION   Patient Name: Barbara Crane MRN: [id number] DOB:2006/05/01, 18 y.o., female Today's Date: 02/06/2024  END OF SESSION:  PT End of Session - 02/06/24 1315     Visit Number 22    Number of Visits 26    Date for PT Re-Evaluation 02/07/24    Authorization Type [name]    PT Start Time 1315    PT Stop Time 1354    PT Time Calculation (min) 39 min    Activity Tolerance Patient tolerated treatment well    Behavior During Therapy [facility] for tasks assessed/performed          Past Medical History:  Diagnosis Date   Atypical mole 03/12/2020   left upper back (moderate to severe)   Labral tear of shoulder    Past Surgical History:  Procedure Laterality Date   ADENOIDECTOMY     SHOULDER ARTHROSCOPY WITH LABRAL REPAIR Right 10/11/2023   Procedure: RIGHT SHOULDER ARTHROSCOPY WITH LABRAL REPAIR;  Surgeon: Genelle Standing, MD;  Location: [facility];  Service: Orthopedics;  Laterality: Right;   TYMPANOPLASTY     Patient Active Problem List   Diagnosis Date Noted   Tear of right glenoid labrum 10/11/2023     REFERRING PROVIDER: Genelle Standing, MD  REFERRING DIAG: [phone] (ICD-10-CM) - Tear of right glenoid labrum, initial encounter  THERAPY DIAG:  Muscle weakness (generalized) - Plan: PT plan of care cert/re-cert  Stiffness of right shoulder, not elsewhere classified - Plan: PT plan of care cert/re-cert  Right shoulder pain, unspecified chronicity - Plan: PT plan of care cert/re-cert  Rationale for Evaluation and Treatment: Rehabilitation  ONSET DATE: DOS  10/11/2023  Days since surgery: 118   SUBJECTIVE:                                                                                                                                                                                      SUBJECTIVE STATEMENT:  Pt reports no issues since last session. Soreness for only a few hours. Has not thrown a  ball yet. Pt feels she is 85% normal. She still feels she is still lack strength in the R shoulder and really notices she is unable to lift as much on the R vs L.    Hand dominance: Right  PERTINENT HISTORY: Right shoulder inferior labral repair on 10/11/2023.    PAIN:  0/10 R shoulder  PRECAUTIONS: Other: per surgical protocol   WEIGHT BEARING RESTRICTIONS: Yes R UE  FALLS:  Has patient fallen in last 6 months? No  LIVING ENVIRONMENT: Lives with: lives with mom Lives in: 2 story home Stairs: yes  OCCUPATION: Pt is a student  PLOF: Independent.  Pt was able to play soccer and throw a ball.    PATIENT GOALS:  to be able to play soccer.  To be able to throw a ball  NEXT MD VISIT:   OBJECTIVE:  Note: Objective measures were completed at Evaluation unless otherwise noted.  DIAGNOSTIC FINDINGS:  Pt is post op.  She had an x ray and MRI last year.    UEFI: UEFI Score = 66 6/12:70/80  7/9 75/50  Interpretation: The indication in the original study is that lower scores indicate that the subject is reporting increased difficulty with the activities as a result of their upper limb condition. While higher scores indicate less severity.  UPPER EXTREMITY MMT:     HHD Right 5/7 Left 5/7 Right 6/12 R 7/9 Left 6/12 L 7/9  Shoulder flexion  14.0 25.6 18.7 28.1 26.4 33.3  Shoulder ext  19.3 14.5      Shoulder abduction  16.2 24.9 17.9 31.6 23.3 26.4  Shoulder adduction          Shoulder internal rotation  14.6  14.4 13.3 18.9 16.0 19.8  Shoulder external rotation  14.1  14.0 16.8 16.5 15.1 16.7  Elbow flexion          Elbow extension          Wrist flexion          Wrist extension          Wrist ulnar deviation          Wrist radial deviation          Wrist pronation          Wrist supination          (Blank rows = not tested).  AROM Right 5/7  Left 5/7 Right 6/12 R 7/9  Shoulder flexion 165  170 full  Shoulder extension      Shoulder abduction 165  165  full  Shoulder adduction      Shoulder internal rotation 60  T6 full  Shoulder external rotation 80  T3 full  Elbow flexion      Elbow extension      Wrist flexion      Wrist extension      Wrist ulnar deviation      Wrist radial deviation      Wrist pronation      Wrist supination      (Blank rows = not tested)   UE Y Balance:   R: Lateral: 66, PM 88, AM 59  L: Lat 69, PM 67, AM 61  UE hop test: 5x 2 block  pass                                                                                                                           TREATMENT DATE:   7/9  [facility] return to throwing program Blue TB 90/90 ER  Side plank with rotation on  elbow and side plank full with arm movement   HEP updates, exam findings, functional testing implications    7/2  UBE 2.5 min, lvl 1 each way  Explosive kneeling push ups 4x5 Tall arm side plank 3x8 1s holds Pike toe taps 3x12 GTB IR/ER 90/90 2x10 Wall ball chest pass (high force) 4x8 8lb ball Double UE Medball slams: vertical 8 lbs Medball toss: vertical 4x3 Bicep curls 10lbs 3x6  6/23  UBE 2.5 min, lvl 1 each way  Bear position shoulder taps in protraction 3x12 Side plank 3x8 1s holds 90/90 RTB IR/ER 3x10 ea Kneeling push 3x8 Single arm blue TB row double band 3x10 OH wall bounce 3x20 Lateral raise 3# 3x8 Arnold press 3# 3x8   6/12 UBE ea way L2 Pt received R shoulder PROM in flexion, abd, ER, and IR w/n pt and tissue tolerance w/n protocol ranges  Supine serratus punches3# 2x15 Supine rhythmic stab's at 90 deg flexion x30 sec ea Prone hz abd 2x10 2# Prone flexion 2x10 2# Prone extension 2x10 2# Prone ER 90/90 2# 2x10 Standing flexion 2# 2x10 Standing scaption 2# 2x10 Standing PNF D2 extension GTB 2x10 Standing PNF D2 flexion GTB 2x10 Quadruped push ups 2x10 Serratus push ups at wall 2x10 Ball rolls 4 way at wall 2.2# ball x30ea Side planks 10sec x7 R only Bicep curls 5# 2x15 Triceps extension GTB  x30 Standing IR/ER 90/90 1# 2x10ea in front of mirror    6/3 UBE ea way L3 Pt received R shoulder PROM in flexion, abd, ER, and IR w/n pt and tissue tolerance w/n protocol ranges  Supine serratus punches3# 2x15 Supine rhythmic stab's at 90 deg flexion x30 sec ea Prone hz abd 2x10 1# Prone flexion 2x10 1# Prone extension 2x10 2# Standing flexion 1# 2x10 Prone row  1# 2x10 Standing scaption 1# 2x10 Quadruped push ups x10 Ball rolls 4 way at wall 2.2# ball x30ea Side planks 10sec x5 Bicep curls 5# 2x10 Triceps extension GTB x30  5/30 UBE ea way L3 Pt received R shoulder PROM in flexion, abd, ER, and IR w/n pt and tissue tolerance w/n protocol ranges  Supine serratus punches3# 2x10 Supine rhythmic stab's at 90 deg flexion x30 sec ea Prone hz abd 2x10 1# Prone flexion 2x10 1# Prone extension 2x10 2# Standing flexion 1# 2x10 Prone row  1# 2x10 Standing scaption 1# 2x10 Wall push ups 2x10 Ball rolls 4 way at wall 2.2# ball x20ea Side planks 10sec x4 TRX row 2x10    5/28 Pt received R shoulder PROM in flexion, abd, ER, and IR w/n pt and tissue tolerance w/n protocol ranges  Supine serratus punch 2# 2x12, 3# x10 Supine rhythmic stab's at 90 deg flexion 3x30 sec Prone hz abd 2x10 1#  Prone row 2x10 2# Prone extension x10 with 0#, x10 with 1# Standing jobe's flexion 1# x10, 2x10 2# Standing scaption 1#  3x10 ER/IR with arm at side 3x10 each with RTB    PATIENT EDUCATION: Education details: post op and protocol restrictions and limitations, ice usage, dx, relevant anatomy, prognosis, POC, exercise form, rationale of exercises, and HEP. Person educated: Patient Education method:  Explanation, Demonstration, Tactile cues, Verbal cues Education comprehension:  verbalized understanding, returned demonstration, verbal cues required, tactile cues required  HOME EXERCISE PROGRAM: Access Code: [email or web address] URL: [email or web address] Date:  10/17/2023 Prepared by: Mose Minerva      ASSESSMENT:  CLINICAL IMPRESSION: Pt 4 months post op. Testing demos 90% strength of surgical vs non-surgical as  well as 90% LSI with functional UE testing battery. Pt able to start throwing program modified for her soccer throwing. 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 Pt would benefit from continued skilled therapy in order to reach goals and maximize functional R UE strength and ROM for full return to PLOF.   OBJECTIVE IMPAIRMENTS: decreased activity tolerance, decreased endurance, decreased ROM, decreased strength, hypomobility, impaired UE functional use, and pain.   ACTIVITY LIMITATIONS: carrying, lifting, bathing, dressing, reach over head, and hygiene/grooming  PARTICIPATION LIMITATIONS: cleaning, driving, community activity, and recreational acitivites  PERSONAL FACTORS:   REHAB POTENTIAL: Good  CLINICAL DECISION MAKING: Stable/uncomplicated  EVALUATION COMPLEXITY: Low   GOALS:  SHORT TERM GOALS:   Pt will be independent and compliant with HEP for improved pain, ROM, strength, and function.  Baseline: Goal status: MET Target date:  11/13/2023   2.  Pt will demo improved R shoulder PROM and AAROM to 90 deg of flexion and 20 deg of ER per protocol for improved stiffness and ROM  Baseline:  Goal status: GOAL MET  4/17 Target date:  11/13/2023  3.  Pt will demo improved R shoulder PROM to 130 deg of flexion and 40 deg of ER per protocol for improved stiffness and ROM  Baseline:  Goal status: MET Target date:  12/04/2023   4.  Pt will demo improved R shoulder AAROM to 130 deg of flexion in supine and 45 deg of ER and 90-100 deg of flexion AROM in supine for improved stiffness and shoulder mobility  Baseline:  Goal status: MTE Target date:  12/11/2023  5.  Pt will wean out of sling per protocol and MD orders without adverse  effects  Baseline:  Goal status: MET Target date:  11/27/2023   6.  Pt will be able to actively elevate R UE > 90 deg in standing without significant shoulder hike for improved reaching  Baseline:  Goal status: MET Target date: 12/25/2023  7.  Pt will be able to perform her self care activities with no > than minimal difficulty.   Goal status:  MET  Target date:  01/08/2024  8.  Pt will be able to reach into an overhead cabinet without difficulty.   Goal status:  MET  Target date:  01/15/2024    LONG TERM GOALS: Target date: POC Date   Pt will be able to perform her ADLs and IADLs without significant difficulty or pain.  Baseline:  Goal status:  MET  2.  Pt will be able to perform her normal reaching and overhead activities without significant difficulty or pain  Baseline:  Goal status: ongoing  3.  Pt will demo R shoulder AROM to be [facility] t/o for performance of ADLs and IADLs.  Baseline:  Goal status: MET 6/12  4.  Pt will demo 5/5 strength t/o R shoulder for functional carrying and lifting and to assist with returning to recreational activities.  Baseline:  Goal status: MET  5. Pt will be able to demonstrate ability to perform 24ft throw without pain and with good technique in order to demonstrate functional improvement in throwing progression.   INITIAL      PLAN:  PT FREQUENCY: 1x/month  PT DURATION:  12 wks  PLANNED INTERVENTIONS: 97164- PT Re-evaluation, 97110-Therapeutic exercises, 97530- Therapeutic activity, [id number]- Neuromuscular re-education, 97535- Self Care, 02859- Manual therapy, [id number]- Aquatic Therapy, [id number]- Electrical stimulation (unattended), 97035- Ultrasound,  Patient/Family education, Taping, Dry Needling, Joint mobilization, Spinal mobilization, Scar mobilization, Cryotherapy, and Moist heat  PLAN FOR NEXT SESSION: return to throwing, power dev, R shoulder functional OH strength  Dale Call PT, DPT 02/06/24 2:20 PM

## 2024-02-07 ENCOUNTER — Encounter: Payer: Self-pay | Admitting: *Deleted

## 2024-02-13 ENCOUNTER — Telehealth: Payer: Self-pay

## 2024-02-13 ENCOUNTER — Encounter (HOSPITAL_BASED_OUTPATIENT_CLINIC_OR_DEPARTMENT_OTHER): Admitting: Orthopaedic Surgery

## 2024-02-13 ENCOUNTER — Encounter (HOSPITAL_BASED_OUTPATIENT_CLINIC_OR_DEPARTMENT_OTHER): Payer: Self-pay

## 2024-02-13 NOTE — Telephone Encounter (Signed)
 Note sent about getting MRI scheduled.

## 2024-02-13 NOTE — Telephone Encounter (Signed)
 I sent message to Andree Ang with [facility] scheduling to see if she can get patient scheduled at [facility]. Awaiting response.

## 2024-02-13 NOTE — Telephone Encounter (Signed)
 Patient was to be seen today to review MRI scan of knee, however, scan had not been done. They were wanting to know if there is any way that they could have the follow up visit to review scan, done virtually since they live further away.

## 2024-02-15 ENCOUNTER — Encounter: Payer: Self-pay | Admitting: Radiology

## 2024-02-15 NOTE — Telephone Encounter (Signed)
 Andree with [facility] Scheduling has left voicemail as well for patient to return call to get scheduled. I sent My Chart message.

## 2024-02-21 ENCOUNTER — Ambulatory Visit (HOSPITAL_COMMUNITY)
Admission: RE | Admit: 2024-02-21 | Discharge: 2024-02-21 | Disposition: A | Discharge status: Home / Self Care | Source: Ambulatory Visit | Attending: Orthopaedic Surgery | Admitting: Orthopaedic Surgery

## 2024-02-21 DIAGNOSIS — M25362 Other instability, left knee: Secondary | ICD-10-CM | POA: Diagnosis not present

## 2024-02-21 DIAGNOSIS — S8992XA Unspecified injury of left lower leg, initial encounter: Secondary | ICD-10-CM | POA: Diagnosis not present

## 2024-03-06 ENCOUNTER — Telehealth (HOSPITAL_BASED_OUTPATIENT_CLINIC_OR_DEPARTMENT_OTHER): Admitting: Orthopaedic Surgery

## 2024-03-06 DIAGNOSIS — M6752 Plica syndrome, left knee: Secondary | ICD-10-CM | POA: Diagnosis not present

## 2024-03-06 NOTE — Progress Notes (Signed)
 Follow-up evaluation, left knee    Provider location: [address], [place], [place] [address] Patient location: [address]  [place] [address]   Interval History:   03/06/2024: Presents today for discussion predominantly of the left knee   PMH/PSH/Family History/Social History/Meds/Allergies:    Past Medical History:  Diagnosis Date   Atypical mole 03/12/2020   left upper back (moderate to severe)   Labral tear of shoulder    Past Surgical History:  Procedure Laterality Date   ADENOIDECTOMY     SHOULDER ARTHROSCOPY WITH LABRAL REPAIR Right 10/11/2023   Procedure: RIGHT SHOULDER ARTHROSCOPY WITH LABRAL REPAIR;  Surgeon: Genelle Standing, MD;  Location: [facility];  Service: Orthopedics;  Laterality: Right;   TYMPANOPLASTY     Social History   Socioeconomic History   Marital status: Single    Spouse name: Not on file   Number of children: Not on file   Years of education: Not on file   Highest education level: Not on file  Occupational History   Not on file  Tobacco Use   Smoking status: Never   Smokeless tobacco: Not on file  Vaping Use   Vaping status: Some Days   Substances: Nicotine  Substance and Sexual Activity   Alcohol use: No   Drug use: No   Sexual activity: Not on file  Other Topics Concern   Not on file  Social History Narrative   Not on file   Social Drivers of Health   Financial Resource Strain: Not on file  Food Insecurity: Not on file  Transportation Needs: Not on file  Physical Activity: Not on file  Stress: Not on file  Social Connections: Not on file   Family History  Family history unknown: Yes   Allergies  Allergen Reactions   Decadron [Dexamethasone Sodium Phosphate] Other (See Comments)    Unsure   Amoxicillin Rash   Sulfa Antibiotics Rash   Current Outpatient Medications  Medication Sig Dispense Refill   acetaminophen  (TYLENOL ) 160 MG/5ML elixir Take 15.6  mLs (500 mg total) by mouth every 4 (four) hours as needed for fever. (Patient not taking: Reported on 12/07/2023) 120 mL 0   ibuprofen  (ADVIL ) 100 MG/5ML suspension Take 11.6 mLs (232 mg total) by mouth every 6 (six) hours as needed. Pain (Patient not taking: Reported on 12/07/2023) 30 mL 0   oxyCODONE  (ROXICODONE ) 5 MG/5ML solution Take 3 mLs (3 mg total) by mouth every 4 (four) hours as needed for severe pain (pain score 7-10). (Patient not taking: Reported on 12/07/2023) 15 mL 0   No current facility-administered medications for this visit.   No results found.  Review of Systems:   A ROS was performed including pertinent positives and negatives as documented in the HPI.   Musculoskeletal Exam:    There were no vitals taken for this visit.  Right shoulder with passive range of motion 135 degrees forward elevation as well as 45 degrees external rotation.  Internal rotation is to T12.  Distal neurosensory exam is intact.  Incisions are well-appearing  Left knee with positive patellar grind, 3 quadrants of lateral patellar motion with full extension.  Range of motion is from -3 to 135 degrees, negative joint line tenderness bilaterally.  Trace effusion  Imaging:    MRI left knee: There is an  enlarged plica otherwise normal MRI  I personally reviewed and interpreted the radiographs.   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  I did gust treatment options including possible arthroscopy with plica excision.  I discussed alternatives including injection.  She will discuss this with her mother and let us  know further steps Plan :    - Plan for left knee arthroscopy with plica excision versus injection      I personally saw and evaluated the patient, and participated in the management and treatment plan.  Elspeth Parker, MD Attending Physician, Orthopedic Surgery  This document was dictated using Dragon voice recognition software. A reasonable attempt  at proof reading has been made to minimize errors.

## 2024-03-07 ENCOUNTER — Encounter (HOSPITAL_BASED_OUTPATIENT_CLINIC_OR_DEPARTMENT_OTHER): Payer: Self-pay | Admitting: Orthopaedic Surgery

## 2024-03-10 ENCOUNTER — Other Ambulatory Visit (HOSPITAL_BASED_OUTPATIENT_CLINIC_OR_DEPARTMENT_OTHER): Payer: Self-pay | Admitting: Orthopaedic Surgery

## 2024-03-10 DIAGNOSIS — S43431A Superior glenoid labrum lesion of right shoulder, initial encounter: Secondary | ICD-10-CM

## 2024-04-20 NOTE — Therapy (Incomplete)
 OUTPATIENT PHYSICAL THERAPY UPPER EXTREMITY Re-EVALUATION   Patient Name: Barbara Crane MRN: [id number] DOB:Oct 02, 2005, 18 y.o., female Today's Date: 04/22/2024  END OF SESSION:  PT End of Session - 04/21/24 1558     Visit Number 23    Number of Visits 28    Date for Recertification  06/02/24    Authorization Type [name]    PT Start Time 1545    PT Stop Time 1628    PT Time Calculation (min) 43 min    Activity Tolerance Patient tolerated treatment well    Behavior During Therapy [facility] for tasks assessed/performed           Past Medical History:  Diagnosis Date   Atypical mole 03/12/2020   left upper back (moderate to severe)   Labral tear of shoulder    Past Surgical History:  Procedure Laterality Date   ADENOIDECTOMY     SHOULDER ARTHROSCOPY WITH LABRAL REPAIR Right 10/11/2023   Procedure: RIGHT SHOULDER ARTHROSCOPY WITH LABRAL REPAIR;  Surgeon: Genelle Standing, MD;  Location: [facility];  Service: Orthopedics;  Laterality: Right;   TYMPANOPLASTY     Patient Active Problem List   Diagnosis Date Noted   Tear of right glenoid labrum 10/11/2023     REFERRING PROVIDER: Genelle Standing, MD  REFERRING DIAG: [phone] (ICD-10-CM) - Tear of right glenoid labrum, initial encounter   THERAPY DIAG:  Right shoulder pain, unspecified chronicity  Muscle weakness (generalized)  Rationale for Evaluation and Treatment: Rehabilitation  ONSET DATE: DOS  10/11/2023  Days since surgery: 193   SUBJECTIVE:                                                                                                                                                                                      SUBJECTIVE STATEMENT:  Pt was last seen on 7/9.  The plan was to schedule for 2 more visits, but she forgot to schedule.  She then became very busy with school.  She didn't perform her exercises for about a month due to the summer and being on vacation.  Pt returned to her  HEP approx 3 weeks ago and states they were harder than before.  Everything is pretty normal.  Pt states she has a heavy backpack and has pain with it on her shoulder.  She has minimal pain with pushing up from the couch.  Pt reports of weakness in shoulder abduction.  Pt states she didn't start the formal throwing program, because she didn't understand it.  Pt tossed a baseball in July for minimal distance without any pain.  Pt hasn't really thrown a baseball.   Pt states  she is planning for knee surgery in December.    Hand dominance: Right  PERTINENT HISTORY: Right shoulder inferior labral repair on 10/11/2023.   Planning for knee surgery in December  PAIN:  1/10 current, 4-5/10 worst, 0/10 best R shoulder  PRECAUTIONS: Other: per surgical protocol   WEIGHT BEARING RESTRICTIONS: Yes R UE  FALLS:  Has patient fallen in last 6 months? No  LIVING ENVIRONMENT: Lives with: lives with mom Lives in: 2 story home Stairs: yes   OCCUPATION: Pt is a Consulting civil engineer  PLOF: Independent.  Pt was able to play soccer and throw a ball.    PATIENT GOALS:  to be able to play soccer.  To be able to throw a ball  NEXT MD VISIT:   OBJECTIVE:  Note: Objective measures were completed at Evaluation unless otherwise noted.  DIAGNOSTIC FINDINGS:  Pt is post op.  She had an x ray and MRI last year.    UEFI: UEFI Score = 66 6/12:70/80  7/9 75/50   UPPER EXTREMITY MMT:     HHD Right 5/7 Left 5/7 Right 6/12 R 7/9 Left 6/12 L 7/9 Right 9/22  Shoulder flexion  14.0 25.6 18.7 28.1 26.4 33.3 20.8  Shoulder ext  19.3 14.5       Shoulder abduction  16.2 24.9 17.9 31.6 23.3 26.4 Scaption 4+/5  Shoulder adduction           Shoulder internal rotation  14.6  14.4 13.3 18.9 16.0 19.8 4+/5 ; 18.5  Shoulder external rotation  14.1  14.0 16.8 16.5 15.1 16.7 5/5 ; 13.3  Elbow flexion           Elbow extension           Wrist flexion           Wrist extension           Wrist ulnar deviation            Wrist radial deviation           Wrist pronation           Wrist supination           (Blank rows = not tested).  AROM Right 5/7  Left 5/7 Right 6/12 R 7/9 Right 9/22  Shoulder flexion 165  170 full 166  Shoulder scaption     158  Shoulder abduction 165  165 full 152  Shoulder adduction       Shoulder internal rotation 60  T6 full 63  Shoulder external rotation 80  T3 full 75  Elbow flexion       Elbow extension       Wrist flexion       Wrist extension       Wrist ulnar deviation       Wrist radial deviation       Wrist pronation       Wrist supination       (Blank rows = not tested)  TREATMENT DATE:   9/22 Re-eval today.  See above for ROM and strength measurements. Reviewed current function, pain levels, and HEP compliance.  Pt completed UEFI.  Reviewed HEP.     PATIENT EDUCATION: Education details:  objective findings, dx, relevant anatomy, POC, and HEP. Person educated: Patient Education method:  Explanation, Demonstration Education comprehension:  verbalized understanding, returned demonstration  HOME EXERCISE PROGRAM: Access Code: [email or web address] URL: [email or web address] Date: 10/17/2023 Prepared by: Mose Minerva      ASSESSMENT:  CLINICAL IMPRESSION: Pt is 6 months post op and was last seen on 7/9.  She forgot to schedule her last visits in PT and became very busy with school.  She has a new order from MD.  Pt is doing well functionally though reports having weakness in shoulder/UE.  She has minimal pain with pushing up from the couch and other surfaces.  Pt hasn't really performed throwing but has tossed a ball.  Pt demonstrates good AROM overall t/o R shoulder.  Pt is weaker in R shoulder compared to her prior findings as evidenced in HHD testing.  PT educated pt concerning objective findings and comparison to  prior testing.  Pt has met all STG's and LTG's #1,3 prior.  Pt would benefit from continued skilled therapy in order to reach goals and maximize functional R UE strength and ROM for full return to PLOF.   OBJECTIVE IMPAIRMENTS: decreased activity tolerance, decreased endurance, decreased ROM, decreased strength, hypomobility, impaired UE functional use, and pain.   ACTIVITY LIMITATIONS: carrying, lifting, bathing, dressing, reach over head, and hygiene/grooming  PARTICIPATION LIMITATIONS: cleaning, driving, community activity, and recreational acitivites  PERSONAL FACTORS:   REHAB POTENTIAL: Good  CLINICAL DECISION MAKING: Stable/uncomplicated  EVALUATION COMPLEXITY: Low   GOALS:  SHORT TERM GOALS:   Pt will be independent and compliant with HEP for improved pain, ROM, strength, and function.  Baseline: Goal status: MET Target date:  11/13/2023   2.  Pt will demo improved R shoulder PROM and AAROM to 90 deg of flexion and 20 deg of ER per protocol for improved stiffness and ROM  Baseline:  Goal status: GOAL MET  4/17 Target date:  11/13/2023  3.  Pt will demo improved R shoulder PROM to 130 deg of flexion and 40 deg of ER per protocol for improved stiffness and ROM  Baseline:  Goal status: MET Target date:  12/04/2023   4.  Pt will demo improved R shoulder AAROM to 130 deg of flexion in supine and 45 deg of ER and 90-100 deg of flexion AROM in supine for improved stiffness and shoulder mobility  Baseline:  Goal status: MET Target date:  12/11/2023  5.  Pt will wean out of sling per protocol and MD orders without adverse effects  Baseline:  Goal status: MET Target date:  11/27/2023   6.  Pt will be able to actively elevate R UE > 90 deg in standing without significant shoulder hike for improved reaching  Baseline:  Goal status: MET Target date: 12/25/2023  7.  Pt will be able to perform her self care activities with no > than minimal difficulty.   Goal status:   MET  Target date:  01/08/2024  8.  Pt will be able to reach into an overhead cabinet without difficulty.   Goal status:  MET  Target date:  01/15/2024    LONG TERM GOALS: Target date: 06/02/2024   Pt will be able to perform her ADLs and IADLs without significant difficulty or pain.  Baseline:  Goal status:  MET  2.  Pt will be able to perform her normal reaching and overhead activities without significant difficulty or pain  Baseline:  Goal status: ongoing  3.  Pt will demo R shoulder AROM to be [facility] t/o for performance of ADLs and IADLs.  Baseline:  Goal status: MET 6/12  4.  Pt will demo 5/5 strength t/o R shoulder for functional carrying and lifting and to assist with returning to recreational activities.  Baseline:  Goal status: NOT MET  5. Pt will be able to demonstrate ability to perform 97ft throw without pain and with good technique in order to demonstrate functional improvement in throwing progression.   INITIAL   6.  Pt will report no pain with pushing up to perform daily transfers.    Goal status:  INITIAL     PLAN:  PT FREQUENCY: 1x/wk or once every other week  PT DURATION:  6 wks  PLANNED INTERVENTIONS: 97164- PT Re-evaluation, 97110-Therapeutic exercises, 97530- Therapeutic activity, 97112- Neuromuscular re-education, 97535- Self Care, 02859- Manual therapy, [phone]- Aquatic Therapy, [id number]- Electrical stimulation (unattended), 97035- Ultrasound, Patient/Family education, Taping, Dry Needling, Joint mobilization, Spinal mobilization, Scar mobilization, Cryotherapy, and Moist heat  PLAN FOR NEXT SESSION:  Review HEP.  Shoulder and scapular strengthening and stabilization, return to throwing.    Leigh Minerva III PT, DPT 04/22/24 11:25 PM

## 2024-04-21 ENCOUNTER — Ambulatory Visit (HOSPITAL_BASED_OUTPATIENT_CLINIC_OR_DEPARTMENT_OTHER): Attending: Orthopaedic Surgery | Admitting: Physical Therapy

## 2024-04-21 ENCOUNTER — Encounter (HOSPITAL_BASED_OUTPATIENT_CLINIC_OR_DEPARTMENT_OTHER): Payer: Self-pay | Admitting: Physical Therapy

## 2024-04-21 DIAGNOSIS — M25511 Pain in right shoulder: Secondary | ICD-10-CM | POA: Diagnosis not present

## 2024-04-21 DIAGNOSIS — M6281 Muscle weakness (generalized): Secondary | ICD-10-CM | POA: Insufficient documentation

## 2024-04-21 DIAGNOSIS — S43431A Superior glenoid labrum lesion of right shoulder, initial encounter: Secondary | ICD-10-CM | POA: Diagnosis not present

## 2024-05-02 ENCOUNTER — Ambulatory Visit (HOSPITAL_BASED_OUTPATIENT_CLINIC_OR_DEPARTMENT_OTHER): Attending: Orthopaedic Surgery | Admitting: Physical Therapy

## 2024-05-02 ENCOUNTER — Encounter (HOSPITAL_BASED_OUTPATIENT_CLINIC_OR_DEPARTMENT_OTHER): Payer: Self-pay | Admitting: Physical Therapy

## 2024-05-02 DIAGNOSIS — G8929 Other chronic pain: Secondary | ICD-10-CM | POA: Diagnosis not present

## 2024-05-02 DIAGNOSIS — R29898 Other symptoms and signs involving the musculoskeletal system: Secondary | ICD-10-CM | POA: Insufficient documentation

## 2024-05-02 DIAGNOSIS — M6281 Muscle weakness (generalized): Secondary | ICD-10-CM | POA: Insufficient documentation

## 2024-05-02 DIAGNOSIS — M25511 Pain in right shoulder: Secondary | ICD-10-CM | POA: Diagnosis not present

## 2024-05-02 DIAGNOSIS — M25611 Stiffness of right shoulder, not elsewhere classified: Secondary | ICD-10-CM | POA: Insufficient documentation

## 2024-05-02 NOTE — Therapy (Addendum)
 OUTPATIENT PHYSICAL THERAPY UPPER EXTREMITY TREATMENT   Patient Name: Barbara Crane MRN: [id number] DOB:[birth year], 18 y.o., female Today's Date: 05/02/2024  END OF SESSION:  PT End of Session - 05/02/24 1346     Visit Number 24    Number of Visits 28    Date for Recertification  06/02/24    Authorization Type [name]    PT Start Time 1346    PT Stop Time 1428    PT Time Calculation (min) 42 min    Activity Tolerance Patient tolerated treatment well    Behavior During Therapy [facility] for tasks assessed/performed           Past Medical History:  Diagnosis Date   Atypical mole 03/12/2020   left upper back (moderate to severe)   Labral tear of shoulder    Past Surgical History:  Procedure Laterality Date   ADENOIDECTOMY     SHOULDER ARTHROSCOPY WITH LABRAL REPAIR Right 10/11/2023   Procedure: RIGHT SHOULDER ARTHROSCOPY WITH LABRAL REPAIR;  Surgeon: Genelle Standing, MD;  Location: [facility];  Service: Orthopedics;  Laterality: Right;   TYMPANOPLASTY     Patient Active Problem List   Diagnosis Date Noted   Tear of right glenoid labrum 10/11/2023     REFERRING PROVIDER: Genelle Standing, MD  REFERRING DIAG: [phone] (ICD-10-CM) - Tear of right glenoid labrum, initial encounter   THERAPY DIAG:  Right shoulder pain, unspecified chronicity  Muscle weakness (generalized)  Stiffness of right shoulder, not elsewhere classified  Chronic right shoulder pain  Other symptoms and signs involving the musculoskeletal system  Rationale for Evaluation and Treatment: Rehabilitation  ONSET DATE: DOS  10/11/2023  Days since surgery: 204   SUBJECTIVE:                                                                                                                                                                                      SUBJECTIVE STATEMENT:  Pt plays recreational soccer and likes to throw things sometimes. Does not play softball. Shoulder  sore today.   Pt states she is planning for knee surgery in December.   Hand dominance: Right  PERTINENT HISTORY: Right shoulder inferior labral repair on 10/11/2023.   Planning for knee surgery in December  PAIN:  1/10 current, 4-5/10 worst, 0/10 best R shoulder  PRECAUTIONS: Other: per surgical protocol   WEIGHT BEARING RESTRICTIONS: Yes R UE  FALLS:  Has patient fallen in last 6 months? No  LIVING ENVIRONMENT: Lives with: lives with mom Lives in: 2 story home Stairs: yes   OCCUPATION: Pt is a Consulting civil engineer  PLOF: Independent.  Pt was able to play soccer and  throw a ball.    PATIENT GOALS:  to be able to play soccer.  To be able to throw a ball  NEXT MD VISIT:   OBJECTIVE:  Note: Objective measures were completed at Evaluation unless otherwise noted.  DIAGNOSTIC FINDINGS:  Pt is post op.  She had an x ray and MRI last year.    UEFI: UEFI Score = 66 6/12:70/80  7/9 75/50   UPPER EXTREMITY MMT:     HHD Right 5/7 Left 5/7 Right 6/12 R 7/9 Left 6/12 L 7/9 Right 9/22  Shoulder flexion  14.0 25.6 18.7 28.1 26.4 33.3 20.8  Shoulder ext  19.3 14.5       Shoulder abduction  16.2 24.9 17.9 31.6 23.3 26.4 Scaption 4+/5  Shoulder adduction           Shoulder internal rotation  14.6  14.4 13.3 18.9 16.0 19.8 4+/5 ; 18.5  Shoulder external rotation  14.1  14.0 16.8 16.5 15.1 16.7 5/5 ; 13.3  Elbow flexion           Elbow extension           Wrist flexion           Wrist extension           Wrist ulnar deviation           Wrist radial deviation           Wrist pronation           Wrist supination           (Blank rows = not tested).  AROM Right 5/7  Left 5/7 Right 6/12 R 7/9 Right 9/22  Shoulder flexion 165  170 full 166  Shoulder scaption     158  Shoulder abduction 165  165 full 152  Shoulder adduction       Shoulder internal rotation 60  T6 full 63  Shoulder external rotation 80  T3 full 75  Elbow flexion       Elbow extension       Wrist  flexion       Wrist extension       Wrist ulnar deviation       Wrist radial deviation       Wrist pronation       Wrist supination       (Blank rows = not tested)                                                                                                                              TREATMENT DATE:  05/02/24 Shoulder taps in push up position against chair x10 Shoulder taps in plank 2x20 Body blade 4x10 Catching/throwing soccer ball to simulate goalie position  Throwing tennis ball into wall  Standing shoulder IR GTB 3x10  9/22 Re-eval today.  See above for ROM and strength measurements. Reviewed current function, pain levels, and HEP compliance.  Pt completed UEFI.  Reviewed HEP.  PATIENT EDUCATION: Education details:  objective findings, dx, relevant anatomy, POC, and HEP. Person educated: Patient Education method:  Explanation, Demonstration Education comprehension:  verbalized understanding, returned demonstration  HOME EXERCISE PROGRAM: Access Code: [email or web address] URL: [email or web address] Date: 10/17/2023 Prepared by: Mose Minerva  ASSESSMENT:  CLINICAL IMPRESSION: Patient tolerated exercise well and is continuing to progress. Exercises focused on strengthening the shoulder muscles and simulating game-like drills to help patient prepare for soccer games. Patient limited by strength, pain, and muscle endurance. Will benefit from PT to address remaining limitations and return to soccer.    OBJECTIVE IMPAIRMENTS: decreased activity tolerance, decreased endurance, decreased ROM, decreased strength, hypomobility, impaired UE functional use, and pain.   ACTIVITY LIMITATIONS: carrying, lifting, bathing, dressing, reach over head, and hygiene/grooming  PARTICIPATION LIMITATIONS: cleaning, driving, community activity, and recreational acitivites  PERSONAL FACTORS:   REHAB POTENTIAL: Good  CLINICAL DECISION MAKING: Stable/uncomplicated  EVALUATION  COMPLEXITY: Low   GOALS:  SHORT TERM GOALS:   Pt will be independent and compliant with HEP for improved pain, ROM, strength, and function.  Baseline: Goal status: MET Target date:  11/13/2023   2.  Pt will demo improved R shoulder PROM and AAROM to 90 deg of flexion and 20 deg of ER per protocol for improved stiffness and ROM  Baseline:  Goal status: GOAL MET  4/17 Target date:  11/13/2023  3.  Pt will demo improved R shoulder PROM to 130 deg of flexion and 40 deg of ER per protocol for improved stiffness and ROM  Baseline:  Goal status: MET Target date:  12/04/2023   4.  Pt will demo improved R shoulder AAROM to 130 deg of flexion in supine and 45 deg of ER and 90-100 deg of flexion AROM in supine for improved stiffness and shoulder mobility  Baseline:  Goal status: MET Target date:  12/11/2023  5.  Pt will wean out of sling per protocol and MD orders without adverse effects  Baseline:  Goal status: MET Target date:  11/27/2023   6.  Pt will be able to actively elevate R UE > 90 deg in standing without significant shoulder hike for improved reaching  Baseline:  Goal status: MET Target date: 12/25/2023  7.  Pt will be able to perform her self care activities with no > than minimal difficulty.   Goal status:  MET  Target date:  01/08/2024  8.  Pt will be able to reach into an overhead cabinet without difficulty.   Goal status:  MET  Target date:  01/15/2024    LONG TERM GOALS: Target date: 06/02/2024   Pt will be able to perform her ADLs and IADLs without significant difficulty or pain.  Baseline:  Goal status:  MET  2.  Pt will be able to perform her normal reaching and overhead activities without significant difficulty or pain  Baseline:  Goal status: ongoing  3.  Pt will demo R shoulder AROM to be [facility] t/o for performance of ADLs and IADLs.  Baseline:  Goal status: MET 6/12  4.  Pt will demo 5/5 strength t/o R shoulder for functional carrying and lifting and  to assist with returning to recreational activities.  Baseline:  Goal status: NOT MET  5. Pt will be able to demonstrate ability to perform 70ft throw without pain and with good technique in order to demonstrate functional improvement in throwing progression.   INITIAL   6.  Pt will report no pain with pushing up to perform daily  transfers.    Goal status:  INITIAL   PLAN:  PT FREQUENCY: 1x/wk or once every other week  PT DURATION:  6 wks  PLANNED INTERVENTIONS: 97164- PT Re-evaluation, 97110-Therapeutic exercises, 97530- Therapeutic activity, 97112- Neuromuscular re-education, 97535- Self Care, 02859- Manual therapy, [phone]- Aquatic Therapy, G0283- Electrical stimulation (unattended), 97035- Ultrasound, Patient/Family education, Taping, Dry Needling, Joint mobilization, Spinal mobilization, Scar mobilization, Cryotherapy, and Moist heat  PLAN FOR NEXT SESSION:  Review HEP.  Shoulder and scapular strengthening and stabilization, return to throwing.    Lili Finder, Student-PT 05/02/2024, 2:30 PM   This entire session was performed under direct supervision and direction of a licensed therapist/therapist assistant . I have personally read, edited and approve of the note as written. 3:35 PM, 05/02/24 Prentice CANDIE Stains PT, DPT Physical Therapist at [facility]

## 2024-05-07 ENCOUNTER — Ambulatory Visit (HOSPITAL_BASED_OUTPATIENT_CLINIC_OR_DEPARTMENT_OTHER): Payer: Self-pay | Admitting: Physical Therapy

## 2024-05-07 ENCOUNTER — Encounter (HOSPITAL_BASED_OUTPATIENT_CLINIC_OR_DEPARTMENT_OTHER): Payer: Self-pay | Admitting: Physical Therapy

## 2024-05-07 DIAGNOSIS — R29898 Other symptoms and signs involving the musculoskeletal system: Secondary | ICD-10-CM

## 2024-05-07 DIAGNOSIS — M6281 Muscle weakness (generalized): Secondary | ICD-10-CM

## 2024-05-07 DIAGNOSIS — G8929 Other chronic pain: Secondary | ICD-10-CM | POA: Diagnosis not present

## 2024-05-07 DIAGNOSIS — M25611 Stiffness of right shoulder, not elsewhere classified: Secondary | ICD-10-CM | POA: Diagnosis not present

## 2024-05-07 DIAGNOSIS — M25511 Pain in right shoulder: Secondary | ICD-10-CM

## 2024-05-07 NOTE — Therapy (Addendum)
 OUTPATIENT PHYSICAL THERAPY UPPER EXTREMITY TREATMENT   Patient Name: Barbara Crane MRN: [id number] DOB:Oct 13, 2005, 18 y.o., female Today's Date: 05/07/2024  END OF SESSION:  PT End of Session - 05/07/24 0845     Visit Number 25    Number of Visits 28    Date for Recertification  06/02/24    Authorization Type Barbara Crane    PT Start Time 0845    PT Stop Time 0928    PT Time Calculation (min) 43 min    Activity Tolerance Patient tolerated treatment well    Behavior During Therapy [facility] for tasks assessed/performed          Past Medical History:  Diagnosis Date   Atypical mole 03/12/2020   left upper back (moderate to severe)   Labral tear of shoulder    Past Surgical History:  Procedure Laterality Date   ADENOIDECTOMY     SHOULDER ARTHROSCOPY WITH LABRAL REPAIR Right 10/11/2023   Procedure: RIGHT SHOULDER ARTHROSCOPY WITH LABRAL REPAIR;  Surgeon: Genelle Standing, MD;  Location: [facility];  Service: Orthopedics;  Laterality: Right;   TYMPANOPLASTY     Patient Active Problem List   Diagnosis Date Noted   Tear of right glenoid labrum 10/11/2023    REFERRING PROVIDER: Genelle Standing, MD  REFERRING DIAG: [phone] (ICD-10-CM) - Tear of right glenoid labrum, initial encounter   THERAPY DIAG:  Right shoulder pain, unspecified chronicity  Chronic right shoulder pain  Other symptoms and signs involving the musculoskeletal system  Muscle weakness (generalized)  Stiffness of right shoulder, not elsewhere classified  Rationale for Evaluation and Treatment: Rehabilitation  ONSET DATE: DOS  10/11/2023  Days since surgery: 209   SUBJECTIVE:                                                                                                                                                                                      SUBJECTIVE STATEMENT:  Patient states she is currently not in any pain. Exercises have caused her a little pain. She was sore  after previous session.   Pt states she is planning for knee surgery in December.   Hand dominance: Right  PERTINENT HISTORY: Right shoulder inferior labral repair on 10/11/2023.   Planning for knee surgery in December  PAIN:  1/10 current, 4-5/10 worst, 0/10 best R shoulder  PRECAUTIONS: Other: per surgical protocol   WEIGHT BEARING RESTRICTIONS: Yes R UE  FALLS:  Has patient fallen in last 6 months? No  LIVING ENVIRONMENT: Lives with: lives with mom Lives in: 2 story home Stairs: yes  OCCUPATION: Pt is a Consulting civil engineer  PLOF: Independent.  Pt was able to play  soccer and throw a ball.    PATIENT GOALS:  to be able to play soccer.  To be able to throw a ball  NEXT MD VISIT:   OBJECTIVE:  Note: Objective measures were completed at Evaluation unless otherwise noted.  DIAGNOSTIC FINDINGS:  Pt is post op.  She had an x ray and MRI last year.    UEFI: UEFI Score = 66 6/12:70/80  7/9 75/50   UPPER EXTREMITY MMT:     HHD Right 5/7 Left 5/7 Right 6/12 R 7/9 Left 6/12 L 7/9 Right 9/22  Shoulder flexion  14.0 25.6 18.7 28.1 26.4 33.3 20.8  Shoulder ext  19.3 14.5       Shoulder abduction  16.2 24.9 17.9 31.6 23.3 26.4 Scaption 4+/5  Shoulder adduction           Shoulder internal rotation  14.6  14.4 13.3 18.9 16.0 19.8 4+/5 ; 18.5  Shoulder external rotation  14.1  14.0 16.8 16.5 15.1 16.7 5/5 ; 13.3  Elbow flexion           Elbow extension           Wrist flexion           Wrist extension           Wrist ulnar deviation           Wrist radial deviation           Wrist pronation           Wrist supination           (Blank rows = not tested).  AROM Right 5/7  Left 5/7 Right 6/12 R 7/9 Right 9/22  Shoulder flexion 165  170 full 166  Shoulder scaption     158  Shoulder abduction 165  165 full 152  Shoulder adduction       Shoulder internal rotation 60  T6 full 63  Shoulder external rotation 80  T3 full 75  Elbow flexion       Elbow extension        Wrist flexion       Wrist extension       Wrist ulnar deviation       Wrist radial deviation       Wrist pronation       Wrist supination       (Blank rows = not tested)                                                                                                                              TREATMENT DATE:  05/07/24 Prone ITYs over green ball 3x10 STM and trigger point release to supraspinatus, infraspinatus, subscapularis, and teres minor/major  Subscapularis release stretch  Body blade 3x10 Med ball tosses into rebound net x10 Tall kneeling ball throws 3x12 Tricep extension x10 RTB  05/02/24 Shoulder taps in push up position against chair x10 Shoulder taps in plank 2x20  Body blade 4x10 Catching/throwing soccer ball to simulate goalie position  Throwing tennis ball into wall  Standing shoulder IR GTB 3x10  9/22 Re-eval today.  See above for ROM and strength measurements. Reviewed current function, pain levels, and HEP compliance.  Pt completed UEFI.  Reviewed HEP.  PATIENT EDUCATION: Education details:  objective findings, dx, relevant anatomy, POC, and HEP. Person educated: Patient Education method:  Explanation, Demonstration Education comprehension:  verbalized understanding, returned demonstration  HOME EXERCISE PROGRAM: Access Code: [email or web address] URL: [email or web address] Date: 10/17/2023 Prepared by: Mose Minerva  ASSESSMENT:  CLINICAL IMPRESSION: Patient tolerated exercise well and is continuing to progress. Manual therapy utilized to decrease hyperactive RC musculature. Exercises focused on strengthening the shoulder muscles and simulating game-like drills. Educated on expected soreness. Will continue to benefit from therapy to address remaining limitations and return to soccer.   OBJECTIVE IMPAIRMENTS: decreased activity tolerance, decreased endurance, decreased ROM, decreased strength, hypomobility, impaired UE functional use, and pain.    ACTIVITY LIMITATIONS: carrying, lifting, bathing, dressing, reach over head, and hygiene/grooming  PARTICIPATION LIMITATIONS: cleaning, driving, community activity, and recreational acitivites  PERSONAL FACTORS:   REHAB POTENTIAL: Good  CLINICAL DECISION MAKING: Stable/uncomplicated  EVALUATION COMPLEXITY: Low   GOALS:  SHORT TERM GOALS:   Pt will be independent and compliant with HEP for improved pain, ROM, strength, and function.  Baseline: Goal status: MET Target date:  11/13/2023   2.  Pt will demo improved R shoulder PROM and AAROM to 90 deg of flexion and 20 deg of ER per protocol for improved stiffness and ROM  Baseline:  Goal status: GOAL MET  4/17 Target date:  11/13/2023  3.  Pt will demo improved R shoulder PROM to 130 deg of flexion and 40 deg of ER per protocol for improved stiffness and ROM  Baseline:  Goal status: MET Target date:  12/04/2023   4.  Pt will demo improved R shoulder AAROM to 130 deg of flexion in supine and 45 deg of ER and 90-100 deg of flexion AROM in supine for improved stiffness and shoulder mobility  Baseline:  Goal status: MET Target date:  12/11/2023  5.  Pt will wean out of sling per protocol and MD orders without adverse effects  Baseline:  Goal status: MET Target date:  11/27/2023   6.  Pt will be able to actively elevate R UE > 90 deg in standing without significant shoulder hike for improved reaching  Baseline:  Goal status: MET Target date: 12/25/2023  7.  Pt will be able to perform her self care activities with no > than minimal difficulty.   Goal status:  MET  Target date:  01/08/2024  8.  Pt will be able to reach into an overhead cabinet without difficulty.   Goal status:  MET  Target date:  01/15/2024    LONG TERM GOALS: Target date: 06/02/2024   Pt will be able to perform her ADLs and IADLs without significant difficulty or pain.  Baseline:  Goal status:  MET  2.  Pt will be able to perform her normal  reaching and overhead activities without significant difficulty or pain  Baseline:  Goal status: ongoing  3.  Pt will demo R shoulder AROM to be [facility] t/o for performance of ADLs and IADLs.  Baseline:  Goal status: MET 6/12  4.  Pt will demo 5/5 strength t/o R shoulder for functional carrying and lifting and to assist with returning to recreational activities.  Baseline:  Goal status: NOT MET  5. Pt will be able to demonstrate ability to perform 29ft throw without pain and with good technique in order to demonstrate functional improvement in throwing progression.   INITIAL   6.  Pt will report no pain with pushing up to perform daily transfers.    Goal status:  INITIAL   PLAN:  PT FREQUENCY: 1x/wk or once every other week  PT DURATION:  6 wks  PLANNED INTERVENTIONS: 97164- PT Re-evaluation, 97110-Therapeutic exercises, 97530- Therapeutic activity, 97112- Neuromuscular re-education, 97535- Self Care, [id number]- Manual therapy, [phone]- Aquatic Therapy, [id number]- Electrical stimulation (unattended), 97035- Ultrasound, Patient/Family education, Taping, Dry Needling, Joint mobilization, Spinal mobilization, Scar mobilization, Cryotherapy, and Moist heat  PLAN FOR NEXT SESSION:  Review HEP.  Shoulder and scapular strengthening and stabilization, return to throwing.    Barbara Crane, Student-PT 05/07/2024, 8:45 AM   This entire session was performed under direct supervision and direction of a licensed therapist/therapist assistant . I have personally read, edited and approve of the note as written. 9:45 AM, 05/07/24 Barbara Crane PT, DPT Physical Therapist at [facility]

## 2024-05-17 ENCOUNTER — Ambulatory Visit (HOSPITAL_BASED_OUTPATIENT_CLINIC_OR_DEPARTMENT_OTHER): Payer: Self-pay | Admitting: Physical Therapy

## 2024-05-17 ENCOUNTER — Encounter (HOSPITAL_BASED_OUTPATIENT_CLINIC_OR_DEPARTMENT_OTHER): Payer: Self-pay | Admitting: Physical Therapy

## 2024-05-17 DIAGNOSIS — M6281 Muscle weakness (generalized): Secondary | ICD-10-CM

## 2024-05-17 DIAGNOSIS — M25611 Stiffness of right shoulder, not elsewhere classified: Secondary | ICD-10-CM | POA: Diagnosis not present

## 2024-05-17 DIAGNOSIS — M25511 Pain in right shoulder: Secondary | ICD-10-CM

## 2024-05-17 DIAGNOSIS — R29898 Other symptoms and signs involving the musculoskeletal system: Secondary | ICD-10-CM | POA: Diagnosis not present

## 2024-05-17 DIAGNOSIS — G8929 Other chronic pain: Secondary | ICD-10-CM | POA: Diagnosis not present

## 2024-05-17 NOTE — Therapy (Addendum)
 OUTPATIENT PHYSICAL THERAPY UPPER EXTREMITY TREATMENT   Patient Name: Barbara Crane MRN: [id number] DOB:08-Sep-2005, 18 y.o., female Today's Date: 05/17/2024  END OF SESSION:  PT End of Session - 05/17/24 0917     Visit Number 26    Number of Visits 28    Date for Recertification  06/02/24    Authorization Type [name]    PT Start Time 9081    PT Stop Time 0958    PT Time Calculation (min) 40 min    Activity Tolerance Patient tolerated treatment well    Behavior During Therapy [facility] for tasks assessed/performed          Past Medical History:  Diagnosis Date   Atypical mole 03/12/2020   left upper back (moderate to severe)   Labral tear of shoulder    Past Surgical History:  Procedure Laterality Date   ADENOIDECTOMY     SHOULDER ARTHROSCOPY WITH LABRAL REPAIR Right 10/11/2023   Procedure: RIGHT SHOULDER ARTHROSCOPY WITH LABRAL REPAIR;  Surgeon: Genelle Standing, MD;  Location: [facility];  Service: Orthopedics;  Laterality: Right;   TYMPANOPLASTY     Patient Active Problem List   Diagnosis Date Noted   Tear of right glenoid labrum 10/11/2023    REFERRING PROVIDER: Genelle Standing, MD  REFERRING DIAG: [phone] (ICD-10-CM) - Tear of right glenoid labrum, initial encounter   THERAPY DIAG:  Right shoulder pain, unspecified chronicity  Muscle weakness (generalized)  Stiffness of right shoulder, not elsewhere classified  Other symptoms and signs involving the musculoskeletal system  Rationale for Evaluation and Treatment: Rehabilitation  ONSET DATE: DOS  10/11/2023  Days since surgery: 219   SUBJECTIVE:                                                                                                                                                                                      SUBJECTIVE STATEMENT:  Reports no pain this morning but reports pain with one of her exercises the other day. Was sore after previous session.   Pt states  she is planning for knee surgery in December.   Hand dominance: Right  PERTINENT HISTORY: Right shoulder inferior labral repair on 10/11/2023.   Planning for knee surgery in December  PAIN:  1/10 current, 4-5/10 worst, 0/10 best R shoulder  PRECAUTIONS: Other: per surgical protocol   WEIGHT BEARING RESTRICTIONS: Yes R UE  FALLS:  Has patient fallen in last 6 months? No  LIVING ENVIRONMENT: Lives with: lives with mom Lives in: 2 story home Stairs: yes  OCCUPATION: Pt is a Consulting civil engineer  PLOF: Independent.  Pt was able to play soccer and throw a ball.  PATIENT GOALS:  to be able to play soccer.  To be able to throw a ball  NEXT MD VISIT:   OBJECTIVE:  Note: Objective measures were completed at Evaluation unless otherwise noted.  DIAGNOSTIC FINDINGS:  Pt is post op.  She had an x ray and MRI last year.    UEFI: UEFI Score = 66 6/12:70/80  7/9 75/50   UPPER EXTREMITY MMT:     HHD Right 5/7 Left 5/7 Right 6/12 R 7/9 Left 6/12 L 7/9 Right 9/22  Shoulder flexion  14.0 25.6 18.7 28.1 26.4 33.3 20.8  Shoulder ext  19.3 14.5       Shoulder abduction  16.2 24.9 17.9 31.6 23.3 26.4 Scaption 4+/5  Shoulder adduction           Shoulder internal rotation  14.6  14.4 13.3 18.9 16.0 19.8 4+/5 ; 18.5  Shoulder external rotation  14.1  14.0 16.8 16.5 15.1 16.7 5/5 ; 13.3  Elbow flexion           Elbow extension           Wrist flexion           Wrist extension           Wrist ulnar deviation           Wrist radial deviation           Wrist pronation           Wrist supination           (Blank rows = not tested).  AROM Right 5/7  Left 5/7 Right 6/12 R 7/9 Right 9/22  Shoulder flexion 165  170 full 166  Shoulder scaption     158  Shoulder abduction 165  165 full 152  Shoulder adduction       Shoulder internal rotation 60  T6 full 63  Shoulder external rotation 80  T3 full 75  Elbow flexion       Elbow extension       Wrist flexion       Wrist extension        Wrist ulnar deviation       Wrist radial deviation       Wrist pronation       Wrist supination       (Blank rows = not tested)                                                                                                                              TREATMENT DATE:  05/17/24 Sci[employer] UE bike x5 minutes  Body blade 3x10 Shoulder flexion with perpendicular resistance 3x10 GTB Shoulder abduction with perpendicular resistance 3x10 RTB Shoulder circles with yellow (2.2#) ball on wall 3x10  Shoulder wall clock with RTB 5x3 rounds Seated OH press 5# DB in both hands 3x10 Serratus punches 3x10 Prone shoulder horizontal abduction x10 Prone shoulder flexion x10  05/07/24 Prone ITYs over  green ball 3x10 STM and trigger point release to supraspinatus, infraspinatus, subscapularis, and teres minor/major  Subscapularis release stretch  Body blade 3x10 Med ball tosses into rebound net x10 Tall kneeling ball throws 3x12 Tricep extension x10 RTB  05/02/24 Shoulder taps in push up position against chair x10 Shoulder taps in plank 2x20 Body blade 4x10 Catching/throwing soccer ball to simulate goalie position  Throwing tennis ball into wall  Standing shoulder IR GTB 3x10  9/22 Re-eval today.  See above for ROM and strength measurements. Reviewed current function, pain levels, and HEP compliance.  Pt completed UEFI.  Reviewed HEP.  PATIENT EDUCATION: Education details:  objective findings, dx, relevant anatomy, POC, and HEP. Person educated: Patient Education method:  Explanation, Demonstration Education comprehension:  verbalized understanding, returned demonstration  HOME EXERCISE PROGRAM: Access Code: [email or web address] URL: [email or web address] Date: 10/17/2023 Prepared by: Mose Minerva  ASSESSMENT:  CLINICAL IMPRESSION: Patient tolerated exercise well and is continuing to progress. Exercises focused on closed chain strengthening of the shoulder muscles and shoulder  stabilization. Demonstrated scapular winging with shoulder abduction. Educated on relevant anatomy and expected soreness. Updated HEP. Verbal cueing to decrease R UT compensation throughout exercises. Will continue to benefit from therapy to address remaining limitations and return to PLOF.   OBJECTIVE IMPAIRMENTS: decreased activity tolerance, decreased endurance, decreased ROM, decreased strength, hypomobility, impaired UE functional use, and pain.   ACTIVITY LIMITATIONS: carrying, lifting, bathing, dressing, reach over head, and hygiene/grooming  PARTICIPATION LIMITATIONS: cleaning, driving, community activity, and recreational acitivites  PERSONAL FACTORS:   REHAB POTENTIAL: Good  CLINICAL DECISION MAKING: Stable/uncomplicated  EVALUATION COMPLEXITY: Low   GOALS:  SHORT TERM GOALS:   Pt will be independent and compliant with HEP for improved pain, ROM, strength, and function.  Baseline: Goal status: MET Target date:  11/13/2023   2.  Pt will demo improved R shoulder PROM and AAROM to 90 deg of flexion and 20 deg of ER per protocol for improved stiffness and ROM  Baseline:  Goal status: GOAL MET  4/17 Target date:  11/13/2023  3.  Pt will demo improved R shoulder PROM to 130 deg of flexion and 40 deg of ER per protocol for improved stiffness and ROM  Baseline:  Goal status: MET Target date:  12/04/2023   4.  Pt will demo improved R shoulder AAROM to 130 deg of flexion in supine and 45 deg of ER and 90-100 deg of flexion AROM in supine for improved stiffness and shoulder mobility  Baseline:  Goal status: MET Target date:  12/11/2023  5.  Pt will wean out of sling per protocol and MD orders without adverse effects  Baseline:  Goal status: MET Target date:  11/27/2023   6.  Pt will be able to actively elevate R UE > 90 deg in standing without significant shoulder hike for improved reaching  Baseline:  Goal status: MET Target date: 12/25/2023  7.  Pt will be able to  perform her self care activities with no > than minimal difficulty.   Goal status:  MET  Target date:  01/08/2024  8.  Pt will be able to reach into an overhead cabinet without difficulty.   Goal status:  MET  Target date:  01/15/2024    LONG TERM GOALS: Target date: 06/02/2024   Pt will be able to perform her ADLs and IADLs without significant difficulty or pain.  Baseline:  Goal status:  MET  2.  Pt will be able to perform her normal reaching and overhead activities  without significant difficulty or pain  Baseline:  Goal status: ongoing  3.  Pt will demo R shoulder AROM to be [facility] t/o for performance of ADLs and IADLs.  Baseline:  Goal status: MET 6/12  4.  Pt will demo 5/5 strength t/o R shoulder for functional carrying and lifting and to assist with returning to recreational activities.  Baseline:  Goal status: NOT MET  5. Pt will be able to demonstrate ability to perform 62ft throw without pain and with good technique in order to demonstrate functional improvement in throwing progression.   INITIAL   6.  Pt will report no pain with pushing up to perform daily transfers.    Goal status:  INITIAL   PLAN:  PT FREQUENCY: 1x/wk or once every other week  PT DURATION:  6 wks  PLANNED INTERVENTIONS: 97164- PT Re-evaluation, 97110-Therapeutic exercises, 97530- Therapeutic activity, 97112- Neuromuscular re-education, 97535- Self Care, 02859- Manual therapy, [phone]- Aquatic Therapy, G0283- Electrical stimulation (unattended), 97035- Ultrasound, Patient/Family education, Taping, Dry Needling, Joint mobilization, Spinal mobilization, Scar mobilization, Cryotherapy, and Moist heat  PLAN FOR NEXT SESSION:  Review HEP.  Shoulder and scapular strengthening and stabilization, return to throwing.    Lili Finder, Student-PT 05/17/2024, 9:58 AM   This entire session was performed under direct supervision and direction of a licensed therapist/therapist assistant . I have personally  read, edited and approve of the note as written. 10:06 AM, 05/17/24 Prentice CANDIE Stains PT, DPT Physical Therapist at [facility]

## 2024-05-19 ENCOUNTER — Encounter (HOSPITAL_BASED_OUTPATIENT_CLINIC_OR_DEPARTMENT_OTHER): Payer: Self-pay | Admitting: Physical Therapy

## 2024-05-19 ENCOUNTER — Ambulatory Visit (HOSPITAL_BASED_OUTPATIENT_CLINIC_OR_DEPARTMENT_OTHER): Admitting: Physical Therapy

## 2024-05-19 DIAGNOSIS — M25611 Stiffness of right shoulder, not elsewhere classified: Secondary | ICD-10-CM

## 2024-05-19 DIAGNOSIS — M25511 Pain in right shoulder: Secondary | ICD-10-CM

## 2024-05-19 DIAGNOSIS — R29898 Other symptoms and signs involving the musculoskeletal system: Secondary | ICD-10-CM | POA: Diagnosis not present

## 2024-05-19 DIAGNOSIS — M6281 Muscle weakness (generalized): Secondary | ICD-10-CM | POA: Diagnosis not present

## 2024-05-19 DIAGNOSIS — G8929 Other chronic pain: Secondary | ICD-10-CM | POA: Diagnosis not present

## 2024-05-19 NOTE — Therapy (Signed)
 OUTPATIENT PHYSICAL THERAPY UPPER EXTREMITY TREATMENT   Patient Name: Barbara Crane MRN: [id number] DOB:[birth year], 18 y.o., female Today's Date: 05/20/2024  END OF SESSION:  PT End of Session - 05/19/24 1536     Visit Number 27    Number of Visits 28    Date for Recertification  06/02/24    Authorization Type [name]    PT Start Time 1535    PT Stop Time 1613    PT Time Calculation (min) 38 min    Activity Tolerance Patient tolerated treatment well    Behavior During Therapy [facility] for tasks assessed/performed           Past Medical History:  Diagnosis Date   Atypical mole 03/12/2020   left upper back (moderate to severe)   Labral tear of shoulder    Past Surgical History:  Procedure Laterality Date   ADENOIDECTOMY     SHOULDER ARTHROSCOPY WITH LABRAL REPAIR Right 10/11/2023   Procedure: RIGHT SHOULDER ARTHROSCOPY WITH LABRAL REPAIR;  Surgeon: Genelle Standing, MD;  Location: [facility];  Service: Orthopedics;  Laterality: Right;   TYMPANOPLASTY     Patient Active Problem List   Diagnosis Date Noted   Tear of right glenoid labrum 10/11/2023    REFERRING PROVIDER: Genelle Standing, MD  REFERRING DIAG: [phone] (ICD-10-CM) - Tear of right glenoid labrum, initial encounter   THERAPY DIAG:  Right shoulder pain, unspecified chronicity  Muscle weakness (generalized)  Stiffness of right shoulder, not elsewhere classified  Rationale for Evaluation and Treatment: Rehabilitation  ONSET DATE: DOS  10/11/2023  Days since surgery: 221   SUBJECTIVE:                                                                                                                                                                                      SUBJECTIVE STATEMENT:  Under the arm hurts with ER in abd. Hurts sometimes to reach into dryer that is on top of washer.   Pt states she is planning for knee surgery in December.   Hand dominance: Right  PERTINENT  HISTORY: Right shoulder inferior labral repair on 10/11/2023.   Planning for knee surgery in December  PAIN:  1/10 current, 4-5/10 worst, 0/10 best R shoulder  PRECAUTIONS: Other: per surgical protocol   WEIGHT BEARING RESTRICTIONS: Yes R UE  FALLS:  Has patient fallen in last 6 months? No  LIVING ENVIRONMENT: Lives with: lives with mom Lives in: 2 story home Stairs: yes  OCCUPATION: Pt is a Consulting civil engineer  PLOF: Independent.  Pt was able to play soccer and throw a ball.    PATIENT GOALS:  to be able to  play soccer.  To be able to throw a ball   OBJECTIVE:  Note: Objective measures were completed at Evaluation unless otherwise noted.  DIAGNOSTIC FINDINGS:  Pt is post op.  She had an x ray and MRI last year.    UEFI: UEFI Score = 66 6/12:70/80  7/9 75/50   UPPER EXTREMITY MMT:     HHD Right 5/7 Left 5/7 Right 6/12 R 7/9 Left 6/12 L 7/9 Right 9/22 Rt/Lt 10/20 Hand held dyno (lb)  Shoulder flexion  14.0 25.6 18.7 28.1 26.4 33.3 20.8 17.3/23.0  Shoulder ext  19.3 14.5        Shoulder abduction  16.2 24.9 17.9 31.6 23.3 26.4 Scaption 4+/5 18.7/13.8  Shoulder adduction            Shoulder internal rotation  14.6  14.4 13.3 18.9 16.0 19.8 4+/5 ; 18.5 14.1/15.8  Shoulder external rotation  14.1  14.0 16.8 16.5 15.1 16.7 5/5 ; 13.3 12.6/13.4  Elbow flexion            Elbow extension            Wrist flexion            Wrist extension            Wrist ulnar deviation            Wrist radial deviation            Wrist pronation            Wrist supination            (Blank rows = not tested).  AROM Right 5/7  Left 5/7 Right 6/12 R 7/9 Right 9/22  Shoulder flexion 165  170 full 166  Shoulder scaption     158  Shoulder abduction 165  165 full 152  Shoulder adduction       Shoulder internal rotation 60  T6 full 63  Shoulder external rotation 80  T3 full 75  Elbow flexion       Elbow extension       Wrist flexion       Wrist extension       Wrist ulnar  deviation       Wrist radial deviation       Wrist pronation       Wrist supination       (Blank rows = not tested)                                                                                                                              TREATMENT DATE:  05/19/24 Red bar- 80-120 deg with hold, cues for weight to heels + core SLS with body blade ER/IR Single leg +Ue diag 5lb weight, assist through flexion to ER Supine with purple band- iso UE hold at 90, leg lower; iso UE hold at 90 abd/90 ER tabletop press out  05/17/24 SciFit UE bike x5 minutes  Body blade 3x10 Shoulder flexion with perpendicular resistance 3x10 GTB Shoulder abduction with perpendicular resistance 3x10 RTB Shoulder circles with yellow (2.2#) ball on wall 3x10  Shoulder wall clock with RTB 5x3 rounds Seated OH press 5# DB in both hands 3x10 Serratus punches 3x10 Prone shoulder horizontal abduction x10 Prone shoulder flexion x10  05/07/24 Prone ITYs over green ball 3x10 STM and trigger point release to supraspinatus, infraspinatus, subscapularis, and teres minor/major  Subscapularis release stretch  Body blade 3x10 Med ball tosses into rebound net x10 Tall kneeling ball throws 3x12 Tricep extension x10 RTB  05/02/24 Shoulder taps in push up position against chair x10 Shoulder taps in plank 2x20 Body blade 4x10 Catching/throwing soccer ball to simulate goalie position  Throwing tennis ball into wall  Standing shoulder IR GTB 3x10  9/22 Re-eval today.  See above for ROM and strength measurements. Reviewed current function, pain levels, and HEP compliance.  Pt completed UEFI.  Reviewed HEP.  PATIENT EDUCATION: Education details:  objective findings, dx, relevant anatomy, POC, and HEP. Person educated: Patient Education method:  Explanation, Demonstration Education comprehension:  verbalized understanding, returned demonstration  HOME EXERCISE PROGRAM: Access Code: [email or web address] URL:  [email or web address] Date: 10/17/2023 Prepared by: Mose Minerva  ASSESSMENT:  CLINICAL IMPRESSION: Utilized full chain stability and core engagement to encourage stable base in shoulder motion. Pt challenged by core strength. Band did slip and hip pt in the right eye- pt denied pain or blurry vision prior to leaving session. Planning knee surgery in December.   OBJECTIVE IMPAIRMENTS: decreased activity tolerance, decreased endurance, decreased ROM, decreased strength, hypomobility, impaired UE functional use, and pain.   ACTIVITY LIMITATIONS: carrying, lifting, bathing, dressing, reach over head, and hygiene/grooming  PARTICIPATION LIMITATIONS: cleaning, driving, community activity, and recreational acitivites  PERSONAL FACTORS:   REHAB POTENTIAL: Good  CLINICAL DECISION MAKING: Stable/uncomplicated  EVALUATION COMPLEXITY: Low   GOALS:  SHORT TERM GOALS:   Pt will be independent and compliant with HEP for improved pain, ROM, strength, and function.  Baseline: Goal status: MET Target date:  11/13/2023   2.  Pt will demo improved R shoulder PROM and AAROM to 90 deg of flexion and 20 deg of ER per protocol for improved stiffness and ROM  Baseline:  Goal status: GOAL MET  4/17 Target date:  11/13/2023  3.  Pt will demo improved R shoulder PROM to 130 deg of flexion and 40 deg of ER per protocol for improved stiffness and ROM  Baseline:  Goal status: MET Target date:  12/04/2023   4.  Pt will demo improved R shoulder AAROM to 130 deg of flexion in supine and 45 deg of ER and 90-100 deg of flexion AROM in supine for improved stiffness and shoulder mobility  Baseline:  Goal status: MET Target date:  12/11/2023  5.  Pt will wean out of sling per protocol and MD orders without adverse effects  Baseline:  Goal status: MET Target date:  11/27/2023   6.  Pt will be able to actively elevate R UE > 90 deg in standing without significant shoulder hike for improved  reaching  Baseline:  Goal status: MET Target date: 12/25/2023  7.  Pt will be able to perform her self care activities with no > than minimal difficulty.   Goal status:  MET  Target date:  01/08/2024  8.  Pt will be able to reach into an overhead cabinet without difficulty.   Goal status:  MET  Target date:  01/15/2024    LONG  TERM GOALS: Target date: 06/02/2024   Pt will be able to perform her ADLs and IADLs without significant difficulty or pain.  Baseline:  Goal status:  MET  2.  Pt will be able to perform her normal reaching and overhead activities without significant difficulty or pain  Baseline:  Goal status: ongoing  3.  Pt will demo R shoulder AROM to be [facility] t/o for performance of ADLs and IADLs.  Baseline:  Goal status: MET 6/12  4.  Pt will demo 5/5 strength t/o R shoulder for functional carrying and lifting and to assist with returning to recreational activities.  Baseline:  Goal status: NOT MET  5. Pt will be able to demonstrate ability to perform 39ft throw without pain and with good technique in order to demonstrate functional improvement in throwing progression.   INITIAL   6.  Pt will report no pain with pushing up to perform daily transfers.    Goal status:  INITIAL   PLAN:  PT FREQUENCY: 1x/wk or once every other week  PT DURATION:  6 wks  PLANNED INTERVENTIONS: 97164- PT Re-evaluation, 97110-Therapeutic exercises, 97530- Therapeutic activity, 97112- Neuromuscular re-education, 97535- Self Care, 02859- Manual therapy, [phone]- Aquatic Therapy, G0283- Electrical stimulation (unattended), 97035- Ultrasound, Patient/Family education, Taping, Dry Needling, Joint mobilization, Spinal mobilization, Scar mobilization, Cryotherapy, and Moist heat  PLAN FOR NEXT SESSION:  Review HEP.  Shoulder and scapular strengthening and stabilization, return to throwing.    Jabriel Vanduyne C. Aamira Bischoff PT, DPT 05/20/24 4:14 PM

## 2024-05-25 NOTE — Therapy (Signed)
 OUTPATIENT PHYSICAL THERAPY UPPER EXTREMITY TREATMENT   Patient Name: Barbara Crane MRN: [id number] DOB:September 14, 2005, 18 y.o., female Today's Date: 05/26/2024  END OF SESSION:  PT End of Session - 05/26/24 0831     Visit Number 28    Number of Visits 29    Date for Recertification  06/02/24    Authorization Type [name]    PT Start Time 9193    PT Stop Time 0849    PT Time Calculation (min) 43 min    Activity Tolerance Patient tolerated treatment well    Behavior During Therapy [facility] for tasks assessed/performed            Past Medical History:  Diagnosis Date   Atypical mole 03/12/2020   left upper back (moderate to severe)   Labral tear of shoulder    Past Surgical History:  Procedure Laterality Date   ADENOIDECTOMY     SHOULDER ARTHROSCOPY WITH LABRAL REPAIR Right 10/11/2023   Procedure: RIGHT SHOULDER ARTHROSCOPY WITH LABRAL REPAIR;  Surgeon: Genelle Standing, MD;  Location: [facility];  Service: Orthopedics;  Laterality: Right;   TYMPANOPLASTY     Patient Active Problem List   Diagnosis Date Noted   Tear of right glenoid labrum 10/11/2023    REFERRING PROVIDER: Genelle Standing, MD  REFERRING DIAG: [phone] (ICD-10-CM) - Tear of right glenoid labrum, initial encounter   THERAPY DIAG:  Right shoulder pain, unspecified chronicity  Muscle weakness (generalized)  Rationale for Evaluation and Treatment: Rehabilitation  ONSET DATE: DOS  10/11/2023  Days since surgery: 228   SUBJECTIVE:                                                                                                                                                                                      SUBJECTIVE STATEMENT:  Pt states she has gotten better and progressed a little bit.  She states her strength is a little better, though still a little iffy.  Pt states her shoulder gets tired easily.   Pt reports she is able to perform her normal reaching and overhead  activities without significant difficulty or pain most of the time.   Pt will report no pain with pushing up to perform daily transfers.  Pt denies any pain after prior treatment just soreness.  Pt states her eye was fine after the band hit her last visit, she had no issues.    Pt states she is planning for knee surgery in December.   Hand dominance: Right  PERTINENT HISTORY: Right shoulder inferior labral repair on 10/11/2023.   Planning for knee surgery in December  PAIN:  0-1/10 current, 4-5/10 worst, 0/10  best R shoulder  PRECAUTIONS: Other: per surgical protocol   WEIGHT BEARING RESTRICTIONS: Yes R UE  FALLS:  Has patient fallen in last 6 months? No  LIVING ENVIRONMENT: Lives with: lives with mom Lives in: 2 story home Stairs: yes  OCCUPATION: Pt is a consulting civil engineer  PLOF: Independent.  Pt was able to play soccer and throw a ball.    PATIENT GOALS:  to be able to play soccer.  To be able to throw a ball   OBJECTIVE:  Note: Objective measures were completed at Evaluation unless otherwise noted.  DIAGNOSTIC FINDINGS:  Pt is post op.  She had an x ray and MRI last year.    UEFI: UEFI Score = 66 6/12:70/80  7/9 75/50   UPPER EXTREMITY MMT:     HHD Right 5/7 Left 5/7 Right 6/12 R 7/9 Left 6/12 L 7/9 Right 9/22 Rt/Lt 10/20 Hand held dyno (lb)  Shoulder flexion  14.0 25.6 18.7 28.1 26.4 33.3 20.8 17.3/23.0  Shoulder ext  19.3 14.5        Shoulder abduction  16.2 24.9 17.9 31.6 23.3 26.4 Scaption 4+/5 18.7/13.8  Shoulder adduction            Shoulder internal rotation  14.6  14.4 13.3 18.9 16.0 19.8 4+/5 ; 18.5 14.1/15.8  Shoulder external rotation  14.1  14.0 16.8 16.5 15.1 16.7 5/5 ; 13.3 12.6/13.4  Elbow flexion            Elbow extension            Wrist flexion            Wrist extension            Wrist ulnar deviation            Wrist radial deviation            Wrist pronation            Wrist supination            (Blank rows = not  tested).  AROM Right 5/7  Left 5/7 Right 6/12 R 7/9 Right 9/22  Shoulder flexion 165  170 full 166  Shoulder scaption     158  Shoulder abduction 165  165 full 152  Shoulder adduction       Shoulder internal rotation 60  T6 full 63  Shoulder external rotation 80  T3 full 75  Elbow flexion       Elbow extension       Wrist flexion       Wrist extension       Wrist ulnar deviation       Wrist radial deviation       Wrist pronation       Wrist supination       (Blank rows = not tested)  TREATMENT DATE:  05/26/24 Reviewed current function and goals.   Reviewed pain level, response to prior treatment, and HEP compliance,  SciFit UE bike x5 minutes (fwd/bwd) Band walks on wall with RTB, Horizontal and in an arc  2x3 reps each Shoulder wall clock with RTB 5x3 rounds Chest pass on plyoback with 2.2# ball x 12, 3.3# 2x12 Shoulder circles with yellow (2.2#) ball on wall 3x10  Standing jobe's flexion 3# 3x10 Body blade at 90 deg flex, in scaption, and ER/IR x 30 sec each   05/19/24 Red bar- 80-120 deg with hold, cues for weight to heels + core SLS with body blade ER/IR Single leg +Ue diag 5lb weight, assist through flexion to ER Supine with purple band- iso UE hold at 90, leg lower; iso UE hold at 90 abd/90 ER tabletop press out  05/17/24 SciFit UE bike x5 minutes  Body blade 3x10 Shoulder flexion with perpendicular resistance 3x10 GTB Shoulder abduction with perpendicular resistance 3x10 RTB Shoulder circles with yellow (2.2#) ball on wall 3x10  Shoulder wall clock with RTB 5x3 rounds Seated OH press 5# DB in both hands 3x10 Serratus punches 3x10 Prone shoulder horizontal abduction x10 Prone shoulder flexion x10  05/07/24 Prone ITYs over green ball 3x10 STM and trigger point release to supraspinatus, infraspinatus, subscapularis, and teres  minor/major  Subscapularis release stretch  Body blade 3x10 Med ball tosses into rebound net x10 Tall kneeling ball throws 3x12 Tricep extension x10 RTB  05/02/24 Shoulder taps in push up position against chair x10 Shoulder taps in plank 2x20 Body blade 4x10 Catching/throwing soccer ball to simulate goalie position  Throwing tennis ball into wall  Standing shoulder IR GTB 3x10  9/22 Re-eval today.  See above for ROM and strength measurements. Reviewed current function, pain levels, and HEP compliance.  Pt completed UEFI.  Reviewed HEP.  PATIENT EDUCATION: Education details:  objective findings, dx, relevant anatomy, POC, and HEP. Person educated: Patient Education method:  Explanation, Demonstration Education comprehension:  verbalized understanding, returned demonstration  HOME EXERCISE PROGRAM: Access Code: [email or web address] URL: [email or web address] Date: 10/17/2023 Prepared by: Mose Minerva  ASSESSMENT:  CLINICAL IMPRESSION: Pt is improving with function as evidenced by subjective reports.  Pt reports she is able to perform her normal reaching and overhead activities without significant difficulty or pain most of the time.  She also has no pain with pushing up to perform daily transfers.  Pt performed exercises to improve strength, kinesthetic awareness, functional stability, and also performance of reaching and overhead activities.  She performed ther-ex and neuro re-ed activities well without c/o's.  Pt partially met LTG #2 and met LTG #6.  Pt tolerated treatment well and had no change in pain after treatment.    OBJECTIVE IMPAIRMENTS: decreased activity tolerance, decreased endurance, decreased ROM, decreased strength, hypomobility, impaired UE functional use, and pain.   ACTIVITY LIMITATIONS: carrying, lifting, bathing, dressing, reach over head, and hygiene/grooming  PARTICIPATION LIMITATIONS: cleaning, driving, community activity, and recreational  acitivites  PERSONAL FACTORS:   REHAB POTENTIAL: Good  CLINICAL DECISION MAKING: Stable/uncomplicated  EVALUATION COMPLEXITY: Low   GOALS:  SHORT TERM GOALS:   Pt will be independent and compliant with HEP for improved pain, ROM, strength, and function.  Baseline: Goal status: MET Target date:  11/13/2023   2.  Pt will demo improved R shoulder PROM and AAROM to 90 deg of flexion and 20 deg of ER per protocol for improved stiffness and ROM  Baseline:  Goal status: GOAL MET  4/17 Target date:  11/13/2023  3.  Pt will demo improved R shoulder PROM to 130 deg of flexion and 40 deg of ER per protocol for improved stiffness and ROM  Baseline:  Goal status: MET Target date:  12/04/2023   4.  Pt will demo improved R shoulder AAROM to 130 deg of flexion in supine and 45 deg of ER and 90-100 deg of flexion AROM in supine for improved stiffness and shoulder mobility  Baseline:  Goal status: MET Target date:  12/11/2023  5.  Pt will wean out of sling per protocol and MD orders without adverse effects  Baseline:  Goal status: MET Target date:  11/27/2023   6.  Pt will be able to actively elevate R UE > 90 deg in standing without significant shoulder hike for improved reaching  Baseline:  Goal status: MET Target date: 12/25/2023  7.  Pt will be able to perform her self care activities with no > than minimal difficulty.   Goal status:  MET  Target date:  01/08/2024  8.  Pt will be able to reach into an overhead cabinet without difficulty.   Goal status:  MET  Target date:  01/15/2024    LONG TERM GOALS: Target date: 06/02/2024   Pt will be able to perform her ADLs and IADLs without significant difficulty or pain.  Baseline:  Goal status:  MET  2.  Pt will be able to perform her normal reaching and overhead activities without significant difficulty or pain  Baseline:  Goal status: 75% MET  10/27  3.  Pt will demo R shoulder AROM to be [facility] t/o for performance of ADLs and  IADLs.  Baseline:  Goal status: MET 6/12  4.  Pt will demo 5/5 strength t/o R shoulder for functional carrying and lifting and to assist with returning to recreational activities.  Baseline:  Goal status: NOT MET  5. Pt will be able to demonstrate ability to perform 60ft throw without pain and with good technique in order to demonstrate functional improvement in throwing progression.   INITIAL   6.  Pt will report no pain with pushing up to perform daily transfers.    Goal status:   GOAL MET   10/27   PLAN:  PT FREQUENCY: 1x/wk or once every other week  PT DURATION:  6 wks  PLANNED INTERVENTIONS: 97164- PT Re-evaluation, 97110-Therapeutic exercises, 97530- Therapeutic activity, 97112- Neuromuscular re-education, 97535- Self Care, 02859- Manual therapy, [phone]- Aquatic Therapy, G0283- Electrical stimulation (unattended), 97035- Ultrasound, Patient/Family education, Taping, Dry Needling, Joint mobilization, Spinal mobilization, Scar mobilization, Cryotherapy, and Moist heat  PLAN FOR NEXT SESSION:  Review HEP.  Assess strength next visit.  PN vs discharge next visit.  Shoulder and scapular strengthening and stabilization, return to throwing.    Leigh Minerva III PT, DPT 05/26/24 12:24 PM

## 2024-05-26 ENCOUNTER — Ambulatory Visit (HOSPITAL_BASED_OUTPATIENT_CLINIC_OR_DEPARTMENT_OTHER): Admitting: Physical Therapy

## 2024-05-26 ENCOUNTER — Encounter (HOSPITAL_BASED_OUTPATIENT_CLINIC_OR_DEPARTMENT_OTHER): Payer: Self-pay | Admitting: Physical Therapy

## 2024-05-26 DIAGNOSIS — M25511 Pain in right shoulder: Secondary | ICD-10-CM | POA: Diagnosis not present

## 2024-05-26 DIAGNOSIS — G8929 Other chronic pain: Secondary | ICD-10-CM | POA: Diagnosis not present

## 2024-05-26 DIAGNOSIS — M6281 Muscle weakness (generalized): Secondary | ICD-10-CM | POA: Diagnosis not present

## 2024-05-26 DIAGNOSIS — M25611 Stiffness of right shoulder, not elsewhere classified: Secondary | ICD-10-CM | POA: Diagnosis not present

## 2024-05-26 DIAGNOSIS — R29898 Other symptoms and signs involving the musculoskeletal system: Secondary | ICD-10-CM | POA: Diagnosis not present

## 2024-06-02 ENCOUNTER — Ambulatory Visit (HOSPITAL_BASED_OUTPATIENT_CLINIC_OR_DEPARTMENT_OTHER): Attending: Orthopaedic Surgery | Admitting: Physical Therapy

## 2024-06-02 ENCOUNTER — Encounter (HOSPITAL_BASED_OUTPATIENT_CLINIC_OR_DEPARTMENT_OTHER): Payer: Self-pay | Admitting: Physical Therapy

## 2024-06-02 ENCOUNTER — Encounter: Payer: Self-pay | Admitting: Radiology

## 2024-06-02 DIAGNOSIS — M25511 Pain in right shoulder: Secondary | ICD-10-CM | POA: Diagnosis not present

## 2024-06-02 DIAGNOSIS — M6281 Muscle weakness (generalized): Secondary | ICD-10-CM | POA: Insufficient documentation

## 2024-06-02 DIAGNOSIS — R29898 Other symptoms and signs involving the musculoskeletal system: Secondary | ICD-10-CM | POA: Diagnosis not present

## 2024-06-02 DIAGNOSIS — G8929 Other chronic pain: Secondary | ICD-10-CM | POA: Diagnosis not present

## 2024-06-02 DIAGNOSIS — M25611 Stiffness of right shoulder, not elsewhere classified: Secondary | ICD-10-CM | POA: Diagnosis not present

## 2024-06-02 NOTE — Therapy (Incomplete)
 OUTPATIENT PHYSICAL THERAPY UPPER EXTREMITY TREATMENT   Patient Name: Barbara [facility] MRN: [id number] DOB:[birth year], 18 y.o., female Today's Date: 06/02/2024  END OF SESSION:      Past Medical History:  Diagnosis Date   Atypical mole 03/12/2020   left upper back (moderate to severe)   Labral tear of shoulder    Past Surgical History:  Procedure Laterality Date   ADENOIDECTOMY     SHOULDER ARTHROSCOPY WITH LABRAL REPAIR Right 10/11/2023   Procedure: RIGHT SHOULDER ARTHROSCOPY WITH LABRAL REPAIR;  Surgeon: Genelle Standing, MD;  Location: [facility];  Service: Orthopedics;  Laterality: Right;   TYMPANOPLASTY     Patient Active Problem List   Diagnosis Date Noted   Tear of right glenoid labrum 10/11/2023    REFERRING PROVIDER: Genelle Standing, MD  REFERRING DIAG: [phone] (ICD-10-CM) - Tear of right glenoid labrum, initial encounter   THERAPY DIAG:  No diagnosis found.  Rationale for Evaluation and Treatment: Rehabilitation  ONSET DATE: DOS  10/11/2023  Days since surgery: 235   SUBJECTIVE:                                                                                                                                                                                      SUBJECTIVE STATEMENT:  Pt reports she had soreness after prior Rx though no increased pain.  Pt denies pain currently.  Pt does have pain with pulling clothes out of a top dryer.  Pt reports having pain with repetitive activities including raking and vacuuming.    Pt states she has gotten better and progressed a little bit.  She states her strength is a little better, though still a little iffy.  Pt states her shoulder gets tired easily.   Pt reports she is able to perform her normal reaching and overhead activities without significant difficulty or pain most of the time.   Pt will report no pain with pushing up to perform daily transfers.  Pt denies any pain after prior treatment just  soreness.  Pt states her eye was fine after the band hit her last visit, she had no issues.    Pt states she is planning for knee surgery in December.   Hand dominance: Right  PERTINENT HISTORY: Right shoulder inferior labral repair on 10/11/2023.   Planning for knee surgery in December  PAIN:  0-1/10 current, 4-5/10 worst, 0/10 best R shoulder  PRECAUTIONS: Other: per surgical protocol   WEIGHT BEARING RESTRICTIONS: Yes R UE  FALLS:  Has patient fallen in last 6 months? No  LIVING ENVIRONMENT: Lives with: lives with mom Lives in: 2 story home Stairs: yes  OCCUPATION: Pt is  a student  PLOF: Independent.  Pt was able to play soccer and throw a ball.    PATIENT GOALS:  to be able to play soccer.  To be able to throw a ball   OBJECTIVE:  Note: Objective measures were completed at Evaluation unless otherwise noted.  DIAGNOSTIC FINDINGS:  Pt is post op.  She had an x ray and MRI last year.    UEFI: UEFI Score = 66 6/12:70/80  7/9 75/50   UPPER EXTREMITY MMT:     HHD Right 5/7 Left 5/7 Right 6/12 R 7/9 Left 6/12 L 7/9 Right 9/22 Rt/Lt 10/20 Hand held dyno (lb) Right 11/3  Shoulder flexion  14.0 25.6 18.7 28.1 26.4 33.3 20.8 17.3/23.0 5/5 ; 20.4  Shoulder ext  19.3 14.5         Shoulder abduction  16.2 24.9 17.9 31.6 23.3 26.4 Scaption 4+/5 18.7/13.8 Scaption 5/5  Shoulder adduction             Shoulder internal rotation  14.6  14.4 13.3 18.9 16.0 19.8 4+/5 ; 18.5 14.1/15.8 4+/5  Shoulder external rotation  14.1  14.0 16.8 16.5 15.1 16.7 5/5 ; 13.3 12.6/13.4 5/5  Elbow flexion             Elbow extension             Wrist flexion             Wrist extension             Wrist ulnar deviation             Wrist radial deviation             Wrist pronation             Wrist supination             (Blank rows = not tested).  AROM Right 5/7  Left 5/7 Right 6/12 R 7/9 Right 9/22  Shoulder flexion 165  170 full 166  Shoulder scaption     158   Shoulder abduction 165  165 full 152  Shoulder adduction       Shoulder internal rotation 60  T6 full 63  Shoulder external rotation 80  T3 full 75  Elbow flexion       Elbow extension       Wrist flexion       Wrist extension       Wrist ulnar deviation       Wrist radial deviation       Wrist pronation       Wrist supination       (Blank rows = not tested)                                                                                                                              TREATMENT DATE:  06/02/24 PT assessed strength.  See above PT educated pt concerning goal progress  Pt has some tenderness in R subscapularis though pt states it's better.   SciFit UE bike x5 minutes (fwd/bwd) Band walks on wall with RTB, Horizontal and in an arc  2x3 reps each Shoulder wall clock with RTB 5x3 rounds Chest pass on plyoback with 2.2# ball x 12, 3.3# 2x12  UEFI:  64/80   05/26/24 Reviewed current function and goals.   Reviewed pain level, response to prior treatment, and HEP compliance,  SciFit UE bike x5 minutes (fwd/bwd) Band walks on wall with RTB, Horizontal and in an arc  2x3 reps each Shoulder wall clock with RTB 5x3 rounds Chest pass on plyoback with 2.2# ball x 12, 3.3# 2x12 Shoulder circles with yellow (2.2#) ball on wall 3x10  Standing jobe's flexion 3# 3x10 Body blade at 90 deg flex, in scaption, and ER/IR x 30 sec each   05/19/24 Red bar- 80-120 deg with hold, cues for weight to heels + core SLS with body blade ER/IR Single leg +Ue diag 5lb weight, assist through flexion to ER Supine with purple band- iso UE hold at 90, leg lower; iso UE hold at 90 abd/90 ER tabletop press out  05/17/24 SciFit UE bike x5 minutes  Body blade 3x10 Shoulder flexion with perpendicular resistance 3x10 GTB Shoulder abduction with perpendicular resistance 3x10 RTB Shoulder circles with yellow (2.2#) ball on wall 3x10  Shoulder wall clock with RTB 5x3 rounds Seated OH press 5# DB in  both hands 3x10 Serratus punches 3x10 Prone shoulder horizontal abduction x10 Prone shoulder flexion x10  05/07/24 Prone ITYs over green ball 3x10 STM and trigger point release to supraspinatus, infraspinatus, subscapularis, and teres minor/major  Subscapularis release stretch  Body blade 3x10 Med ball tosses into rebound net x10 Tall kneeling ball throws 3x12 Tricep extension x10 RTB  05/02/24 Shoulder taps in push up position against chair x10 Shoulder taps in plank 2x20 Body blade 4x10 Catching/throwing soccer ball to simulate goalie position  Throwing tennis ball into wall  Standing shoulder IR GTB 3x10  9/22 Re-eval today.  See above for ROM and strength measurements. Reviewed current function, pain levels, and HEP compliance.  Pt completed UEFI.  Reviewed HEP.  PATIENT EDUCATION: Education details:  objective findings, dx, relevant anatomy, POC, and HEP. Person educated: Patient Education method:  Explanation, Demonstration Education comprehension:  verbalized understanding, returned demonstration  HOME EXERCISE PROGRAM: Access Code: [email or web address] URL: [email or web address] Date: 10/17/2023 Prepared by: Mose Minerva  ASSESSMENT:  CLINICAL IMPRESSION: Pt is improving with function as evidenced by subjective reports.  Pt reports she is able to perform her normal reaching and overhead activities without significant difficulty or pain most of the time.  She also has no pain with pushing up to perform daily transfers.  Pt performed exercises to improve strength, kinesthetic awareness, functional stability, and also performance of reaching and overhead activities.  She performed ther-ex and neuro re-ed activities well without c/o's.  Pt partially met LTG #2 and met LTG #6.  Pt tolerated treatment well and had no change in pain after treatment.    Pt demonstrates improved scaption strength, but continues to have a minimal deficit in IR strength.  R shoulder flexion  strength is at 89% of L. Pt demonstrates worse self perceived disability    OBJECTIVE IMPAIRMENTS: decreased activity tolerance, decreased endurance, decreased ROM, decreased strength, hypomobility, impaired UE functional use, and pain.   ACTIVITY LIMITATIONS: carrying, lifting, bathing, dressing, reach over head, and hygiene/grooming  PARTICIPATION LIMITATIONS: cleaning, driving, community activity, and recreational acitivites  PERSONAL FACTORS:   REHAB POTENTIAL: Good  CLINICAL DECISION MAKING: Stable/uncomplicated  EVALUATION COMPLEXITY: Low   GOALS:  SHORT TERM GOALS:   Pt will be independent and compliant with HEP for improved pain, ROM, strength, and function.  Baseline: Goal status: MET Target date:  11/13/2023   2.  Pt will demo improved R shoulder PROM and AAROM to 90 deg of flexion and 20 deg of ER per protocol for improved stiffness and ROM  Baseline:  Goal status: GOAL MET  4/17 Target date:  11/13/2023  3.  Pt will demo improved R shoulder PROM to 130 deg of flexion and 40 deg of ER per protocol for improved stiffness and ROM  Baseline:  Goal status: MET Target date:  12/04/2023   4.  Pt will demo improved R shoulder AAROM to 130 deg of flexion in supine and 45 deg of ER and 90-100 deg of flexion AROM in supine for improved stiffness and shoulder mobility  Baseline:  Goal status: MET Target date:  12/11/2023  5.  Pt will wean out of sling per protocol and MD orders without adverse effects  Baseline:  Goal status: MET Target date:  11/27/2023   6.  Pt will be able to actively elevate R UE > 90 deg in standing without significant shoulder hike for improved reaching  Baseline:  Goal status: MET Target date: 12/25/2023  7.  Pt will be able to perform her self care activities with no > than minimal difficulty.   Goal status:  MET  Target date:  01/08/2024  8.  Pt will be able to reach into an overhead cabinet without difficulty.   Goal status:   MET  Target date:  01/15/2024    LONG TERM GOALS: Target date: 06/02/2024   Pt will be able to perform her ADLs and IADLs without significant difficulty or pain.  Baseline:  Goal status:  MET  2.  Pt will be able to perform her normal reaching and overhead activities without significant difficulty or pain  Baseline:  Goal status:  GOAL MET  11/3  3.  Pt will demo R shoulder AROM to be [facility] t/o for performance of ADLs and IADLs.  Baseline:  Goal status: MET 6/12  4.  Pt will demo 5/5 strength t/o R shoulder for functional carrying and lifting and to assist with returning to recreational activities.  Baseline:  Goal status: 75%  MET  5. Pt will be able to demonstrate ability to perform 38ft throw without pain and with good technique in order to demonstrate functional improvement in throwing progression.   INITIAL   6.  Pt will report no pain with pushing up to perform daily transfers.    Goal status:   GOAL MET   10/27   PLAN:  PT FREQUENCY: 1x/wk or once every other week  PT DURATION:  6 wks  PLANNED INTERVENTIONS: 97164- PT Re-evaluation, 97110-Therapeutic exercises, 97530- Therapeutic activity, 97112- Neuromuscular re-education, 97535- Self Care, 02859- Manual therapy, [phone]- Aquatic Therapy, [id number]- Electrical stimulation (unattended), 97035- Ultrasound, Patient/Family education, Taping, Dry Needling, Joint mobilization, Spinal mobilization, Scar mobilization, Cryotherapy, and Moist heat  PLAN FOR NEXT SESSION:  Plan for discharge next visit.  Review and ensure independence with HEP.  Assess strength next visit.  PN vs discharge next visit.  Shoulder and scapular strengthening and stabilization, return to throwing.    Leigh Minerva III PT, DPT 06/02/24 8:18 AM

## 2024-06-09 ENCOUNTER — Ambulatory Visit (HOSPITAL_BASED_OUTPATIENT_CLINIC_OR_DEPARTMENT_OTHER): Admitting: Physical Therapy

## 2024-06-12 ENCOUNTER — Ambulatory Visit (HOSPITAL_BASED_OUTPATIENT_CLINIC_OR_DEPARTMENT_OTHER): Admitting: Physical Therapy

## 2024-06-12 ENCOUNTER — Encounter (HOSPITAL_BASED_OUTPATIENT_CLINIC_OR_DEPARTMENT_OTHER): Payer: Self-pay | Admitting: Physical Therapy

## 2024-06-12 DIAGNOSIS — M6281 Muscle weakness (generalized): Secondary | ICD-10-CM | POA: Diagnosis not present

## 2024-06-12 DIAGNOSIS — M25511 Pain in right shoulder: Secondary | ICD-10-CM

## 2024-06-12 DIAGNOSIS — R29898 Other symptoms and signs involving the musculoskeletal system: Secondary | ICD-10-CM | POA: Diagnosis not present

## 2024-06-12 DIAGNOSIS — G8929 Other chronic pain: Secondary | ICD-10-CM | POA: Diagnosis not present

## 2024-06-12 DIAGNOSIS — M25611 Stiffness of right shoulder, not elsewhere classified: Secondary | ICD-10-CM

## 2024-06-12 NOTE — Therapy (Addendum)
 OUTPATIENT PHYSICAL THERAPY UPPER EXTREMITY DISCHARGE  PHYSICAL THERAPY DISCHARGE SUMMARY  Visits from Start of Care: 30  Current functional level related to goals / functional outcomes: See below   Remaining deficits: See below    Education / Equipment: See below   Patient agrees to discharge. Patient goals were met. Patient is being discharged due to meeting the stated rehab goals.   Patient Name: Barbara [facility] MRN: [id number] DOB:18-Sep-2005, 18 y.o., female Today's Date: 06/12/2024  END OF SESSION:  PT End of Session - 06/12/24 0932     Visit Number 30    Number of Visits 30    Date for Recertification  06/12/24    Authorization Type Barbara [facility]    PT Start Time 9066    PT Stop Time 1008    PT Time Calculation (min) 35 min    Activity Tolerance Patient tolerated treatment well    Behavior During Therapy Barbara [facility] for tasks assessed/performed         Past Medical History:  Diagnosis Date   Atypical mole 03/12/2020   left upper back (moderate to severe)   Labral tear of shoulder    Past Surgical History:  Procedure Laterality Date   ADENOIDECTOMY     SHOULDER ARTHROSCOPY WITH LABRAL REPAIR Right 10/11/2023   Procedure: RIGHT SHOULDER ARTHROSCOPY WITH LABRAL REPAIR;  Surgeon: Barbara Standing, MD;  [facility];  Service: Orthopedics;  Laterality: Right;   TYMPANOPLASTY     Patient Active Problem List   Diagnosis Date Noted   Tear of right glenoid labrum 10/11/2023    REFERRING PROVIDER: Genelle Standing, MD  REFERRING DIAG: [phone] (ICD-10-CM) - Tear of right glenoid labrum, initial encounter   THERAPY DIAG:  Right shoulder pain, unspecified chronicity  Muscle weakness (generalized)  Stiffness of right shoulder, not elsewhere classified  Other symptoms and signs involving the musculoskeletal system  Chronic right shoulder pain  Rationale for Evaluation and Treatment: Rehabilitation  ONSET DATE: DOS  10/11/2023  Days  since surgery: 245   SUBJECTIVE:                                                                                                                                                                                      SUBJECTIVE STATEMENT:  Reports she is doing okay today. States she feels like she is at 85%, only being limited with strength and overhead throwing of soccer ball.    Pt states she is planning for knee surgery in December.   Hand dominance: Right  PERTINENT HISTORY: Right shoulder inferior labral repair on 10/11/2023.   Planning for knee surgery in December  PAIN:  0-1/10  current, 4-5/10 worst, 0/10 best R shoulder  PRECAUTIONS: Other: per surgical protocol   WEIGHT BEARING RESTRICTIONS: Yes R UE  FALLS:  Has patient fallen in last 6 months? No  LIVING ENVIRONMENT: Lives with: lives with mom Lives in: 2 story home Stairs: yes  OCCUPATION: Pt is a consulting civil engineer  PLOF: Independent.  Pt was able to play soccer and throw a ball.    PATIENT GOALS:  to be able to play soccer.  To be able to throw a ball   OBJECTIVE:  Note: Objective measures were completed at Evaluation unless otherwise noted.  DIAGNOSTIC FINDINGS:  Pt is post op.  She had an x ray and MRI last year.    UEFI: UEFI Score = 66  6/12:70/80  7/9 75/50   UPPER EXTREMITY MMT:     HHD Right 5/7 Left 5/7 Right 6/12 R 7/9 Left 6/12 L 7/9 Right 9/22 Rt/Lt 10/20 Hand held dyno (lb) Right 11/3 Right 06/12/24 Left 06/12/24  Shoulder flexion  14.0 25.6 18.7 28.1 26.4 33.3 20.8 17.3/23.0 5/5 ; 20.4 21.8 25.1  Shoulder ext  19.3 14.5           Shoulder abduction  16.2 24.9 17.9 31.6 23.3 26.4 Scaption 4+/5 18.7/13.8 Scaption 5/5 Scaption: 21.9   Shoulder adduction               Shoulder internal rotation  14.6  14.4 13.3 18.9 16.0 19.8 4+/5 ; 18.5 14.1/15.8 4+/5 5/5   Shoulder external rotation  14.1  14.0 16.8 16.5 15.1 16.7 5/5 ; 13.3 12.6/13.4 5/5 5/5   Elbow flexion               Elbow  extension               Wrist flexion               Wrist extension               Wrist ulnar deviation               Wrist radial deviation               Wrist pronation               Wrist supination               (Blank rows = not tested).  AROM Right 5/7  Left 5/7 Right 6/12 R 7/9 Right 9/22 Right 06/12/24  Shoulder flexion 165  170 full 166 full  Shoulder scaption     158 full  Shoulder abduction 165  165 full 152 full  Shoulder adduction        Shoulder internal rotation 60  T6 full 63 full  Shoulder external rotation 80  T3 full 75 full  Elbow flexion        Elbow extension        Wrist flexion        Wrist extension        Wrist ulnar deviation        Wrist radial deviation        Wrist pronation        Wrist supination        (Blank rows = not tested)  TREATMENT DATE:  06/12/24 SciFit UE bike x5 min  Assessed overhead soccer throwing mechanics  Assessed throwing mechanics with tennis ball   06/02/24 PT assessed strength.  See above PT educated pt concerning goal progress Pt has some tenderness in R subscapularis though pt states it's better.   SciFit UE bike x5 minutes (fwd/bwd) Band walks on wall with RTB, Horizontal and in an arc  2x3 reps each Shoulder wall clock with RTB 5x3 rounds Chest pass on plyoback with 2.2# ball x 12, 3.3# 2x12  UEFI:  64/80  05/26/24 Reviewed current function and goals.   Reviewed pain level, response to prior treatment, and HEP compliance,  SciFit UE bike x5 minutes (fwd/bwd) Band walks on wall with RTB, Horizontal and in an arc  2x3 reps each Shoulder wall clock with RTB 5x3 rounds Chest pass on plyoback with 2.2# ball x 12, 3.3# 2x12 Shoulder circles with yellow (2.2#) ball on wall 3x10  [facility] jobe's flexion 3# 3x10 Body blade at 90 deg flex, in scaption, and ER/IR x 30 sec  each   05/19/24 Red bar- 80-120 deg with hold, cues for weight to heels + core SLS with body blade ER/IR Single leg +Ue diag 5lb weight, assist through flexion to ER Supine with purple band- iso UE hold at 90, leg lower; iso UE hold at 90 abd/90 ER tabletop press out  05/17/24 SciFit UE bike x5 minutes  Body blade 3x10 Shoulder flexion with perpendicular resistance 3x10 GTB Shoulder abduction with perpendicular resistance 3x10 RTB Shoulder circles with yellow (2.2#) ball on wall 3x10  Shoulder wall clock with RTB 5x3 rounds Seated OH press 5# DB in both hands 3x10 Serratus punches 3x10 Prone shoulder horizontal abduction x10 Prone shoulder flexion x10  05/07/24 Prone ITYs over green ball 3x10 STM and trigger point release to supraspinatus, infraspinatus, subscapularis, and teres minor/major  Subscapularis release stretch  Body blade 3x10 Med ball tosses into rebound net x10 Tall kneeling ball throws 3x12 Tricep extension x10 RTB  05/02/24 Shoulder taps in push up position against chair x10 Shoulder taps in plank 2x20 Body blade 4x10 Catching/throwing soccer ball to simulate goalie position  Throwing tennis ball into wall  [facility] shoulder IR GTB 3x10  9/22 Re-eval today.  See above for ROM and strength measurements. Reviewed current function, pain levels, and HEP compliance.  Pt completed UEFI.  Reviewed HEP.  PATIENT EDUCATION: Education details:  objective findings, dx, relevant anatomy, POC, and HEP. Person educated: Patient Education method:  Explanation, Demonstration Education comprehension:  verbalized understanding, returned demonstration  HOME EXERCISE PROGRAM: Access Code: [email or web address] URL: [email or web address] Date: 10/17/2023 Prepared by: Mose Minerva  ASSESSMENT:  CLINICAL IMPRESSION: Patient has met 8/8 short term goals and 6/6 long term goals with ability to complete HEP and improvement in symptoms, strength, ROM, activity  tolerance, gait, balance, and functional mobility. Patient remains limited with overhead throwing of soccer ball. Educated patient on overhead throwing mechanics and progressing to throwing longer distances. Patient to be discharged due to being able to independently manage HEP and soccer ball throwing progression.   OBJECTIVE IMPAIRMENTS: decreased activity tolerance, decreased endurance, decreased ROM, decreased strength, hypomobility, impaired UE functional use, and pain.   ACTIVITY LIMITATIONS: carrying, lifting, bathing, dressing, reach over head, and hygiene/grooming  PARTICIPATION LIMITATIONS: cleaning, driving, community activity, and recreational acitivites  PERSONAL FACTORS:   REHAB POTENTIAL: Good  CLINICAL DECISION MAKING: Stable/uncomplicated  EVALUATION COMPLEXITY: Low   GOALS:  SHORT TERM GOALS:   Pt will be independent and  compliant with HEP for improved pain, ROM, strength, and function.  Baseline: Goal status: MET Target date:  11/13/2023   2.  Pt will demo improved R shoulder PROM and AAROM to 90 deg of flexion and 20 deg of ER per protocol for improved stiffness and ROM  Baseline:  Goal status: GOAL MET  4/17 Target date:  11/13/2023  3.  Pt will demo improved R shoulder PROM to 130 deg of flexion and 40 deg of ER per protocol for improved stiffness and ROM  Baseline:  Goal status: MET Target date:  12/04/2023   4.  Pt will demo improved R shoulder AAROM to 130 deg of flexion in supine and 45 deg of ER and 90-100 deg of flexion AROM in supine for improved stiffness and shoulder mobility  Baseline:  Goal status: MET Target date:  12/11/2023  5.  Pt will wean out of sling per protocol and MD orders without adverse effects  Baseline:  Goal status: MET Target date:  11/27/2023   6.  Pt will be able to actively elevate R UE > 90 deg in [place] without significant shoulder hike for improved reaching  Baseline:  Goal status: MET Target date: 12/25/2023  7.   Pt will be able to perform her self care activities with no > than minimal difficulty.   Goal status:  MET  Target date:  01/08/2024  8.  Pt will be able to reach into an overhead cabinet without difficulty.   Goal status:  MET  Target date:  01/15/2024    LONG TERM GOALS: Target date: 06/09/2024   Pt will be able to perform her ADLs and IADLs without significant difficulty or pain.  Baseline:  Goal status:  MET  2.  Pt will be able to perform her normal reaching and overhead activities without significant difficulty or pain  Baseline:  Goal status:  GOAL MET  11/3  3.  Pt will demo R shoulder AROM to be [facility] t/o for performance of ADLs and IADLs.  Baseline:  Goal status: MET 6/12  4.  Pt will demo 5/5 strength t/o R shoulder for functional carrying and lifting and to assist with returning to recreational activities.  Baseline:  Goal status: MET 06/12/24  5. Pt will be able to demonstrate ability to perform 5ft throw without pain and with good technique in order to demonstrate functional improvement in throwing progression.   MET 06/12/24  6.  Pt will report no pain with pushing up to perform daily transfers.    Goal status:   GOAL MET   10/27   PLAN:  PT FREQUENCY: 1 more visit  PT DURATION:  1 week  PLANNED INTERVENTIONS: 97164- PT Re-evaluation, 97110-Therapeutic exercises, 97530- Therapeutic activity, 97112- Neuromuscular re-education, 97535- Self Care, 02859- Manual therapy, [phone]- Aquatic Therapy, [phone]- Electrical stimulation (unattended), 97035- Ultrasound, Patient/Family education, Taping, Dry Needling, Joint mobilization, Spinal mobilization, Scar mobilization, Cryotherapy, and Moist heat  PLAN FOR NEXT SESSION:  N/A   Lili Finder, Student-PT 06/12/2024, 10:12 AM  This entire session was performed under direct supervision and direction of a licensed therapist/therapist assistant . I have personally read, edited and approve of the note as written. 10:18  AM, 06/12/24 Prentice CANDIE Stains PT, DPT Physical Therapist at [facility]

## 2024-07-20 ENCOUNTER — Encounter (HOSPITAL_BASED_OUTPATIENT_CLINIC_OR_DEPARTMENT_OTHER): Payer: Self-pay | Admitting: Orthopaedic Surgery

## 2024-07-29 ENCOUNTER — Other Ambulatory Visit: Payer: Self-pay | Admitting: Family Medicine

## 2024-07-29 DIAGNOSIS — N6320 Unspecified lump in the left breast, unspecified quadrant: Secondary | ICD-10-CM
